# Patient Record
Sex: Female | Born: 1970 | Race: White | Hispanic: Yes | Marital: Married | Smoking: Never smoker
Health system: Southern US, Community
[De-identification: ages and names within clinical notes are randomized; demographics above are authoritative.]

## PROBLEM LIST (undated history)

## (undated) DIAGNOSIS — G8929 Other chronic pain: Secondary | ICD-10-CM

## (undated) DIAGNOSIS — E349 Endocrine disorder, unspecified: Secondary | ICD-10-CM

## (undated) DIAGNOSIS — F419 Anxiety disorder, unspecified: Secondary | ICD-10-CM

## (undated) DIAGNOSIS — F32A Depression, unspecified: Secondary | ICD-10-CM

## (undated) DIAGNOSIS — M549 Dorsalgia, unspecified: Secondary | ICD-10-CM

## (undated) DIAGNOSIS — E079 Disorder of thyroid, unspecified: Secondary | ICD-10-CM

## (undated) DIAGNOSIS — F329 Major depressive disorder, single episode, unspecified: Secondary | ICD-10-CM

## (undated) HISTORY — DX: Endocrine disorder, unspecified: E34.9

## (undated) HISTORY — PX: TUBAL LIGATION: SHX77

## (undated) HISTORY — DX: Disorder of thyroid, unspecified: E07.9

## (undated) HISTORY — DX: Dorsalgia, unspecified: M54.9

## (undated) HISTORY — DX: Anxiety disorder, unspecified: F41.9

## (undated) HISTORY — PX: BACK SURGERY: SHX140

## (undated) HISTORY — DX: Other chronic pain: G89.29

## (undated) HISTORY — PX: PELVIC LAPAROSCOPY: SHX162

## (undated) HISTORY — DX: Depression, unspecified: F32.A

## (undated) HISTORY — DX: Major depressive disorder, single episode, unspecified: F32.9

---

## 2015-09-08 HISTORY — PX: HEMORRHOID SURGERY: SHX153

## 2016-08-14 ENCOUNTER — Ambulatory Visit: Payer: Medicaid Other | Admitting: Family Medicine

## 2016-08-26 ENCOUNTER — Ambulatory Visit: Payer: Medicaid Other | Admitting: Family Medicine

## 2016-12-07 ENCOUNTER — Ambulatory Visit: Payer: Self-pay | Admitting: Obstetrics & Gynecology

## 2016-12-07 ENCOUNTER — Ambulatory Visit: Payer: Self-pay | Admitting: Gynecology

## 2016-12-21 ENCOUNTER — Ambulatory Visit: Payer: Self-pay | Admitting: Obstetrics & Gynecology

## 2016-12-29 ENCOUNTER — Other Ambulatory Visit: Payer: Self-pay | Admitting: Internal Medicine

## 2016-12-29 DIAGNOSIS — Z1231 Encounter for screening mammogram for malignant neoplasm of breast: Secondary | ICD-10-CM

## 2016-12-30 ENCOUNTER — Ambulatory Visit: Payer: Medicare HMO | Admitting: Obstetrics & Gynecology

## 2017-01-07 ENCOUNTER — Ambulatory Visit (INDEPENDENT_AMBULATORY_CARE_PROVIDER_SITE_OTHER): Payer: Medicare HMO | Admitting: Obstetrics & Gynecology

## 2017-01-07 ENCOUNTER — Encounter: Payer: Self-pay | Admitting: Obstetrics & Gynecology

## 2017-01-07 VITALS — BP 142/86 | Ht 62.5 in | Wt 168.0 lb

## 2017-01-07 DIAGNOSIS — N920 Excessive and frequent menstruation with regular cycle: Secondary | ICD-10-CM

## 2017-01-07 DIAGNOSIS — Z113 Encounter for screening for infections with a predominantly sexual mode of transmission: Secondary | ICD-10-CM

## 2017-01-07 DIAGNOSIS — N9412 Deep dyspareunia: Secondary | ICD-10-CM | POA: Diagnosis not present

## 2017-01-07 DIAGNOSIS — Z1151 Encounter for screening for human papillomavirus (HPV): Secondary | ICD-10-CM

## 2017-01-07 DIAGNOSIS — R102 Pelvic and perineal pain: Secondary | ICD-10-CM | POA: Diagnosis not present

## 2017-01-07 DIAGNOSIS — Z01411 Encounter for gynecological examination (general) (routine) with abnormal findings: Secondary | ICD-10-CM | POA: Diagnosis not present

## 2017-01-07 LAB — CBC
HCT: 39.3 % (ref 35.0–45.0)
Hemoglobin: 12.9 g/dL (ref 11.7–15.5)
MCH: 29.5 pg (ref 27.0–33.0)
MCHC: 32.8 g/dL (ref 32.0–36.0)
MCV: 89.9 fL (ref 80.0–100.0)
MPV: 10.4 fL (ref 7.5–12.5)
PLATELETS: 300 10*3/uL (ref 140–400)
RBC: 4.37 MIL/uL (ref 3.80–5.10)
RDW: 12.9 % (ref 11.0–15.0)
WBC: 6.5 10*3/uL (ref 3.8–10.8)

## 2017-01-07 LAB — TSH: TSH: 2.5 mIU/L

## 2017-01-07 NOTE — Patient Instructions (Addendum)
1. Encounter for gynecological examination with abnormal finding Gyn exam moderately tender with slightly increased size of Uterus.  Pap test/HPV HR, Gono-Chlam done.  2. Female pelvic pain Complete STI screen.  F/U Pelvic US to assess Uterus and Ovaries.  R/O Fibroids, Ovarian mass. - HIV antibody - RPR - Hepatitis C Antibody - Hepatitis B Surface AntiGEN - US Transvaginal Non-OB; Future  3. Deep dyspareunia Investigation as above.  4. Menorrhagia with regular cycle R/O Anemia/Undertreated Hypothyroidism.  F/U Pelvic US, R/O IU lesion/Polyps/Fibroids/Thickened Endometrium. - CBC - TSH - US Transvaginal Non-OB; Future  Fue un placer conocerla!

## 2017-01-07 NOTE — Progress Notes (Signed)
Jamie SaverRosa H Garner Jamie Garner [birth year] [id number]   History:    46 y.o. [id number] Married.  S/P BT/S.  New patient presenting for annual gyn exam   Past medical history,surgical history, family history and social history were all reviewed and documented in the EPIC chart.  Gynecologic History Patient's last menstrual period was 12/30/2016. Contraception: tubal ligation Last Pap: 2015. Results were: normal Last mammogram: 2016. Results were: normal  Obstetric History OB History  Gravida Para Term Preterm AB Living  4 3     1 3   SAB TAB Ectopic Multiple Live Births  1            # Outcome Date GA Lbr Len/2nd Weight Sex Delivery Anes PTL Lv  4 SAB           3 Para           2 Para           1 Para                ROS: A ROS was performed and pertinent positives and negatives are included in the history.  GENERAL: No fevers or chills. HEENT: No change in vision, no earache, sore throat or sinus congestion. NECK: No pain or stiffness. CARDIOVASCULAR: No chest pain or pressure. No palpitations. PULMONARY: No shortness of breath, cough or wheeze. GASTROINTESTINAL: No abdominal pain, nausea, vomiting or diarrhea, melena or bright red blood per rectum. GENITOURINARY: No urinary frequency, urgency, hesitancy or dysuria. MUSCULOSKELETAL: No joint or muscle pain, no back pain, no recent trauma. DERMATOLOGIC: No rash, no itching, no lesions. ENDOCRINE: No polyuria, polydipsia, no heat or cold intolerance. No recent change in weight. HEMATOLOGICAL: No anemia or easy bruising or bleeding. NEUROLOGIC: No headache, seizures, numbness, tingling or weakness. PSYCHIATRIC: No depression, no loss of interest in normal activity or change in sleep pattern.     Exam:   BP (!) 142/86   Ht 5' 2.5" (1.588 m)   Wt 168 lb (76.2 kg)   LMP 12/30/2016   BMI 30.24 kg/m   Body mass index is 30.24 kg/m.  General appearance : Well developed well nourished female. No acute distress HEENT: Eyes: no retinal  hemorrhage or exudates,  Neck supple, trachea midline, no carotid bruits, no thyroidmegaly Lungs: Clear to auscultation, no rhonchi or wheezes, or rib retractions  Heart: Regular rate and rhythm, no murmurs or gallops Breast:Examined in sitting and supine position were symmetrical in appearance, no palpable masses or tenderness,  no skin retraction, no nipple inversion, no nipple discharge, no skin discoloration, no axillary or supraclavicular lymphadenopathy Abdomen: no palpable masses or tenderness, no rebound or guarding Extremities: no edema or skin discoloration or tenderness  Pelvic:  Bartholin, Urethra, Skene Glands: Within normal limits             Vagina: No gross lesions or discharge  Cervix: No gross lesions or discharge.  Pap/HPV/Gono-Chlam done  Uterus  AV, slightly increased size, shape and consistency, moderately tender and mobile  Adnexa  Without masses or tenderness  Anus and perineum  normal     Assessment/Plan:  46 y.o. female for annual exam   1. Encounter for gynecological examination with abnormal finding Gyn exam moderately tender with slightly increased size of Uterus.  Pap test/HPV HR, Gono-Chlam done.  2. Female pelvic pain Complete STI screen.  F/U Pelvic US to assess Uterus and Ovaries.  R/O Fibroids, Ovarian mass. - HIV antibody - RPR - Hepatitis C Antibody - Hepatitis  B Surface AntiGEN - US Transvaginal Non-OB; Future  3. Deep dyspareunia As above  4. Menorrhagia with regular cycle R/O Anemia/Undertreated thyroidism.  F/U Pelvic US, R/O IU lesion/Polyps/Fibroids/Thickened Endometrium. - CBC - TSH - US Transvaginal Non-OB; Future  Counseling >50% x 20 minutes on above issues/Investigation and Treatment.  Genia Del MD, 12:27 PM 01/07/2017

## 2017-01-08 LAB — HEPATITIS B SURFACE ANTIGEN: HEP B S AG: NEGATIVE

## 2017-01-08 LAB — RPR

## 2017-01-08 LAB — HEPATITIS C ANTIBODY: HCV AB: NEGATIVE

## 2017-01-08 LAB — PAP IG, CT-NG NAA, HPV HIGH-RISK
Chlamydia Probe Amp: NOT DETECTED
GC Probe Amp: NOT DETECTED
HPV DNA HIGH RISK: NOT DETECTED

## 2017-01-08 LAB — HIV ANTIBODY (ROUTINE TESTING W REFLEX): HIV 1&2 Ab, 4th Generation: NONREACTIVE

## 2017-01-13 ENCOUNTER — Ambulatory Visit (INDEPENDENT_AMBULATORY_CARE_PROVIDER_SITE_OTHER): Payer: Medicare HMO

## 2017-01-13 ENCOUNTER — Encounter: Payer: Self-pay | Admitting: Obstetrics & Gynecology

## 2017-01-13 ENCOUNTER — Ambulatory Visit (INDEPENDENT_AMBULATORY_CARE_PROVIDER_SITE_OTHER): Payer: Medicare HMO | Admitting: Obstetrics & Gynecology

## 2017-01-13 ENCOUNTER — Other Ambulatory Visit: Payer: Self-pay | Admitting: Obstetrics & Gynecology

## 2017-01-13 VITALS — BP 130/80 | Ht 62.0 in | Wt 168.0 lb

## 2017-01-13 DIAGNOSIS — N83299 Other ovarian cyst, unspecified side: Secondary | ICD-10-CM

## 2017-01-13 DIAGNOSIS — N920 Excessive and frequent menstruation with regular cycle: Secondary | ICD-10-CM

## 2017-01-13 DIAGNOSIS — R102 Pelvic and perineal pain: Secondary | ICD-10-CM | POA: Diagnosis not present

## 2017-01-13 DIAGNOSIS — N83202 Unspecified ovarian cyst, left side: Secondary | ICD-10-CM

## 2017-01-13 MED ORDER — NORETHINDRONE 0.35 MG PO TABS: 1.0000 | ORAL_TABLET | Freq: Every day | ORAL | 4 refills | Status: AC

## 2017-01-13 NOTE — Progress Notes (Signed)
    Jamie Garner 04/30/1971 [id number]        46 y.o.  [id number]   RP:  F/U Menorrhagia and pelvic pain for Pelvic US  Past medical history,surgical history, problem list, medications, allergies, family history and social history were all reviewed and documented in the EPIC chart.  Directed ROS with pertinent positives and negatives documented in the history of present illness/assessment and plan.  Exam:  Vitals:   01/13/17 1228  BP: 130/80  Weight: 168 lb (76.2 kg)  Height: 5\' 2"  (1.575 m)   General appearance:  Normal  Pelvic US today:  T/V Anteverted Uterus with homogeneous echo.  Tri-Layered endometrium.  Endometrial line at 13.7 mm.  Rt Ovary with a collapsing cyst.  No increased flow to the cyst wall by doppler.  Lt Ovary with 2 follicular simple cysts at 2.4 cm and 2.5 cm.  Dopplers negative to both cysts.  No FF in CDS.  Hb 12.9 TSH 2.5 normal  Assessment/Plan:  46 y.o. [id number]   1. Menorrhagia with regular cycle Pelvic US with normal Endometrial line, on day #15 of cycle, at 13.7 mm trilayered. US findings and labs reviewed with patient.  Patient reassured about results.  Decision to start on Progestin-only pill to control Sxs, both the heavy menstrual flow and decrease the occurrence of functional ovarian cysts.  Usage/risks/benefits reviewed.  2. Functional ovarian cysts Follicular ovarian cysts.    Counseling on above issues >50% x 15 minutes.  Genia DelMarie-Lyne Snow Peoples MD, 12:51 PM 01/13/2017

## 2017-01-13 NOTE — Patient Instructions (Signed)
1. Menorrhagia with regular cycle Pelvic US with normal Endometrial line, on day #15 of cycle, at 13.7 mm trilayered. US findings and labs reviewed with patient.  Patient reassured about results.  Decision to start on Progestin-only pill to control Sxs, both the heavy menstrual flow and decrease the occurrence of functional ovarian cysts.  Usage/risks/benefits reviewed.  2. Functional ovarian cysts Follicular ovarian cysts.    Jamie Garner, if you do well with the Progestin-only pill, I will see you back for your Annual-Gyn exam in a year.  Please call if you need me before that time.

## 2017-01-15 ENCOUNTER — Ambulatory Visit
Admission: RE | Admit: 2017-01-15 | Discharge: 2017-01-15 | Disposition: A | Discharge status: Home / Self Care | Payer: Medicare HMO | Source: Ambulatory Visit | Attending: Internal Medicine | Admitting: Internal Medicine

## 2017-01-15 DIAGNOSIS — Z1231 Encounter for screening mammogram for malignant neoplasm of breast: Secondary | ICD-10-CM

## 2017-01-20 ENCOUNTER — Encounter: Payer: Self-pay | Admitting: Gynecology

## 2017-03-10 ENCOUNTER — Emergency Department (HOSPITAL_COMMUNITY)
Admission: EM | Admit: 2017-03-10 | Discharge: 2017-03-11 | Disposition: A | Discharge status: Home / Self Care | Payer: Medicare HMO | Attending: Emergency Medicine | Admitting: Emergency Medicine

## 2017-03-10 ENCOUNTER — Encounter (HOSPITAL_COMMUNITY): Payer: Self-pay | Admitting: Vascular Surgery

## 2017-03-10 DIAGNOSIS — Z79899 Other long term (current) drug therapy: Secondary | ICD-10-CM | POA: Diagnosis not present

## 2017-03-10 DIAGNOSIS — N920 Excessive and frequent menstruation with regular cycle: Secondary | ICD-10-CM | POA: Diagnosis not present

## 2017-03-10 DIAGNOSIS — N938 Other specified abnormal uterine and vaginal bleeding: Secondary | ICD-10-CM | POA: Diagnosis present

## 2017-03-10 LAB — CBC WITH DIFFERENTIAL/PLATELET
BASOS PCT: 0 %
Basophils Absolute: 0 10*3/uL (ref 0.0–0.1)
EOS ABS: 0.5 10*3/uL (ref 0.0–0.7)
Eosinophils Relative: 7 %
HCT: 39.3 % (ref 36.0–46.0)
Hemoglobin: 13 g/dL (ref 12.0–15.0)
Lymphocytes Relative: 32 %
Lymphs Abs: 2.2 10*3/uL (ref 0.7–4.0)
MCH: 29.3 pg (ref 26.0–34.0)
MCHC: 33.1 g/dL (ref 30.0–36.0)
MCV: 88.5 fL (ref 78.0–100.0)
MONO ABS: 0.5 10*3/uL (ref 0.1–1.0)
MONOS PCT: 7 %
Neutro Abs: 3.8 10*3/uL (ref 1.7–7.7)
Neutrophils Relative %: 54 %
PLATELETS: 310 10*3/uL (ref 150–400)
RBC: 4.44 MIL/uL (ref 3.87–5.11)
RDW: 13.4 % (ref 11.5–15.5)
WBC: 7 10*3/uL (ref 4.0–10.5)

## 2017-03-10 LAB — BASIC METABOLIC PANEL
Anion gap: 7 (ref 5–15)
BUN: 16 mg/dL (ref 6–20)
CALCIUM: 9 mg/dL (ref 8.9–10.3)
CO2: 22 mmol/L (ref 22–32)
CREATININE: 0.69 mg/dL (ref 0.44–1.00)
Chloride: 106 mmol/L (ref 101–111)
Glucose, Bld: 94 mg/dL (ref 65–99)
Potassium: 4.8 mmol/L (ref 3.5–5.1)
SODIUM: 135 mmol/L (ref 135–145)

## 2017-03-10 LAB — I-STAT BETA HCG BLOOD, ED (MC, WL, AP ONLY)

## 2017-03-10 MED ORDER — ONDANSETRON 4 MG PO TBDP
4.0000 mg | ORAL_TABLET | Freq: Once | ORAL | Status: AC
Start: 2017-03-10 — End: 2017-03-10
  Administered 2017-03-10: 4 mg via ORAL
  Filled 2017-03-10: qty 1

## 2017-03-10 MED ORDER — HYDROCODONE-ACETAMINOPHEN 5-325 MG PO TABS
1.0000 | ORAL_TABLET | Freq: Once | ORAL | Status: AC
  Administered 2017-03-10: 1 via ORAL
  Filled 2017-03-10: qty 1

## 2017-03-10 NOTE — ED Provider Notes (Signed)
[facility]-EMERGENCY DEPT Provider Note   By signing my name below, I, Earmon Phoenix, attest that this documentation has been prepared under the direction and in the presence of [facility], [place]. Electronically Signed: Earmon Phoenix, ED Scribe. 03/10/17. 12:09 AM.    History   Chief Complaint Chief Complaint  Patient presents with  . Vaginal Bleeding   The history is provided by the patient and medical records. A language interpreter was used.    Jamie Garner is a 46 y.o. female who presents to the Emergency Department complaining of constant vaginal bleeding for the past 3 weeks. She reports passing blood clots as well. She reports associated lower abdominal pain that radiates to her back, palpitations, SOB, nausea, dizziness and generalized weakness. She rates the pain at 9/10. She states she soaks through about 1 large pads several times daily. She has been not taken anything for pain. There are no modifying factors noted. She denies fever, chills, vomiting. She states her OB/GYN is Dr. Seymour Bars and last saw her two months ago and was prescribe birth control pills. She states three cysts were noted at the visit but does not specify location. She reports that Dr. Seymour Bars told her if the problem persist that she will need to have a hysterectomy. She does not have a follow up appt scheduled. She reports having an ultrasound while in the office. She reports an allergy to ASA with a reaction of eye swelling.   Past Medical History:  Diagnosis Date  . Anxiety   . Back pain, chronic   . Depression   . Hormone disorder   . Thyroid disease     There are no active problems to display for this patient.   Past Surgical History:  Procedure Laterality Date  . BACK SURGERY  2011, 2013, 2015   x3  . HEMORRHOID SURGERY  2017  . PELVIC LAPAROSCOPY     ovarian cyst removed  . TUBAL LIGATION      OB History    Gravida Para Term Preterm AB Living   4 3     1 3    SAB TAB Ectopic  Multiple Live Births   1               Home Medications    Prior to Admission medications   Medication Sig Start Date End Date Taking? Authorizing Provider  cholecalciferol (VITAMIN D) 1000 units tablet Take 1,000 Units by mouth daily.    [provider]  clonazePAM (KLONOPIN) 1 MG tablet Take 1 mg by mouth 2 (two) times daily.    [provider]  FLUoxetine (PROZAC) 20 MG tablet Take 20 mg by mouth daily.    [provider]  gabapentin (NEURONTIN) 800 MG tablet Take 800 mg by mouth 3 (three) times daily.    [provider]  levothyroxine (SYNTHROID, LEVOTHROID) 50 MCG tablet Take 50 mcg by mouth daily before breakfast.    [provider]  nabumetone (RELAFEN) 500 MG tablet Take 500 mg by mouth daily.    [provider]  norethindrone (ORTHO MICRONOR) 0.35 MG tablet Take 1 tablet (0.35 mg total) by mouth daily. 01/13/17   Genia Del, MD  traMADol (ULTRAM) 50 MG tablet Take 1 tablet (50 mg total) by mouth every 6 (six) hours as needed. 03/11/17   Janne Napoleon, NP    Family History Family History  Problem Relation Age of Onset  . Breast cancer Maternal Grandmother   . Diabetes Maternal Grandfather  Social History Social History  Substance Use Topics  . Smoking status: Never Smoker  . Smokeless tobacco: Never Used  . Alcohol use No     Allergies   Aspirin and Penicillins   Review of Systems Review of Systems  Constitutional: Positive for fatigue.  Respiratory: Positive for shortness of breath.   Cardiovascular: Positive for palpitations.  Gastrointestinal: Positive for abdominal pain and nausea. Negative for vomiting.  Genitourinary: Positive for vaginal bleeding.  Musculoskeletal: Positive for back pain.  Neurological: Positive for dizziness and weakness (generalized).     Physical Exam Updated Vital Signs BP (!) 149/89 (BP Location: Right Arm)   Pulse 70   Temp 98.1 F (36.7 C) (Oral)   Resp 20    SpO2 99%   Physical Exam  Constitutional: She is oriented to person, place, and time. She appears well-developed and well-nourished. No distress.  HENT:  Head: Normocephalic.  Eyes: EOM are normal.  Neck: Neck supple.  Cardiovascular: Normal rate.   Pulmonary/Chest: Effort normal.  Abdominal: Soft. Bowel sounds are normal. There is tenderness.  Tender with deep palpation of the lower abdomen.   Genitourinary:  Genitourinary Comments: No blood on pad patient removed in exam room. External genitalia without lesions, scant dark blood vaginal vault, cervix closed, no CMT, no adnexal mass palpated, uterus without palpable enlargement.   Musculoskeletal: Normal range of motion.  Neurological: She is alert and oriented to person, place, and time. No cranial nerve deficit.  Skin: Skin is warm and dry.  Psychiatric: She has a normal mood and affect. Her behavior is normal.  Nursing note and vitals reviewed.    ED Treatments / Results  DIAGNOSTIC STUDIES: Oxygen Saturation is 99% on RA, normal by my interpretation.   COORDINATION OF CARE: 10:59 PM- Will order labs and perform pelvic exam. Will order pain and nausea medication. Pt verbalizes understanding and agrees to plan.  Medications  ondansetron (ZOFRAN-ODT) disintegrating tablet 4 mg (4 mg Oral Given 03/10/17 2354)  HYDROcodone-acetaminophen (NORCO/VICODIN) 5-325 MG per tablet 1 tablet (1 tablet Oral Given 03/10/17 2354)    Labs (all labs ordered are listed, but only abnormal results are displayed) Labs Reviewed  WET PREP, GENITAL  CBC WITH DIFFERENTIAL/PLATELET  BASIC METABOLIC PANEL  I-STAT BETA HCG BLOOD, ED ([facility], [facility], [facility] ONLY)  GC/CHLAMYDIA PROBE AMP ([place]) NOT AT [facility]   Radiology No results found.  Procedures Pelvic exam Date/Time: 03/10/2017 11:00 PM Performed by: Janne NapoleonNEESE, Yazleemar Strassner M Authorized by: Janne NapoleonNEESE, Kristiane Morsch M  Consent: Verbal consent obtained. Risks and benefits: risks, benefits and alternatives were  discussed Consent given by: patient Patient understanding: patient states understanding of the procedure being performed Test results: test results available and properly labeled Required items: required blood products, implants, devices, and special equipment available Patient identity confirmed: verbally with patient Local anesthesia used: no  Anesthesia: Local anesthesia used: no  Sedation: Patient sedated: no Patient tolerance: Patient tolerated the procedure well with no immediate complications    (including critical care time)  Medications Ordered in ED Medications  ondansetron (ZOFRAN-ODT) disintegrating tablet 4 mg (4 mg Oral Given 03/10/17 2354)  HYDROcodone-acetaminophen (NORCO/VICODIN) 5-325 MG per tablet 1 tablet (1 tablet Oral Given 03/10/17 2354)     Initial Impression / Assessment and Plan / ED Course  I have reviewed the triage vital signs and the nursing notes.  Pertinent lab results that were available during my care of the patient were reviewed by me and considered in my medical decision making (see chart for details).  Final Clinical Impressions(s) / ED Diagnoses  46 y.o. female with hx of heavy vaginal bleeding and pain and currently under treatment by Dr. Seymour Bars (GYN) stable for d/c without active bleeding and normal CBC. She is not orthostatic and does not have a surgical abdomen. Instructed the patient to f/u with DR. Lavoie or if symptoms worsen go to [facility] for further evaluation.  Final diagnoses:  Menorrhagia with regular cycle    New Prescriptions New Prescriptions   TRAMADOL (ULTRAM) 50 MG TABLET    Take 1 tablet (50 mg total) by mouth every 6 (six) hours as needed.   I personally performed the services described in this documentation, which was scribed in my presence. The recorded information has been reviewed and is accurate.    Kerrie [place], [place] 03/11/17 0017    Lavera Guise, MD 03/11/17 [phone]

## 2017-03-10 NOTE — ED Notes (Signed)
ED Provider at bedside. 

## 2017-03-10 NOTE — ED Triage Notes (Signed)
Pt reports to the ED for eval of vaginal bleeding. Onset June 13th. She states that the bleeding has been constant since then. She also reports generalized weakness, tachycardia, and lower abd pain. She went to her OB-GYN and she was given the birth control pills 2 months ago and she has been taking them since but they are no longer helping. Pt is spanish speaking, language line use for triage.

## 2017-03-11 LAB — WET PREP, GENITAL
CLUE CELLS WET PREP: NONE SEEN
SPERM: NONE SEEN
TRICH WET PREP: NONE SEEN
YEAST WET PREP: NONE SEEN

## 2017-03-11 LAB — GC/CHLAMYDIA PROBE AMP (~~LOC~~) NOT AT ARMC
Chlamydia: NEGATIVE
Neisseria Gonorrhea: NEGATIVE

## 2017-03-11 MED ORDER — TRAMADOL HCL 50 MG PO TABS: 50.0000 mg | ORAL_TABLET | Freq: Four times a day (QID) | ORAL | 0 refills | Status: DC | PRN

## 2017-03-11 NOTE — Discharge Instructions (Signed)
Your blood work tonight and vital signs show that your bleeding has not caused you to be anemic. We are treating your pain. Continue to take the pills that Dr. Seymour BarsLavoie prescribed for your bleeding. Call Dr. Seymour BarsLavoie tomorrow to schedule a follow up appointment. If your symptoms worsen before then go to [facility] Emergency Department for further evaluation.

## 2017-03-17 ENCOUNTER — Ambulatory Visit (INDEPENDENT_AMBULATORY_CARE_PROVIDER_SITE_OTHER): Payer: Medicare HMO | Admitting: Specialist

## 2017-04-07 ENCOUNTER — Ambulatory Visit (INDEPENDENT_AMBULATORY_CARE_PROVIDER_SITE_OTHER): Payer: Medicare HMO

## 2017-04-07 ENCOUNTER — Encounter (INDEPENDENT_AMBULATORY_CARE_PROVIDER_SITE_OTHER): Payer: Self-pay | Admitting: Specialist

## 2017-04-07 ENCOUNTER — Ambulatory Visit (INDEPENDENT_AMBULATORY_CARE_PROVIDER_SITE_OTHER): Payer: Medicare HMO | Admitting: Specialist

## 2017-04-07 VITALS — BP 118/82 | HR 71 | Ht 63.0 in | Wt 168.0 lb

## 2017-04-07 DIAGNOSIS — G8929 Other chronic pain: Secondary | ICD-10-CM | POA: Diagnosis not present

## 2017-04-07 DIAGNOSIS — R29898 Other symptoms and signs involving the musculoskeletal system: Secondary | ICD-10-CM | POA: Diagnosis not present

## 2017-04-07 DIAGNOSIS — Z981 Arthrodesis status: Secondary | ICD-10-CM | POA: Diagnosis not present

## 2017-04-07 DIAGNOSIS — M5441 Lumbago with sciatica, right side: Secondary | ICD-10-CM

## 2017-04-07 MED ORDER — TRAMADOL HCL 50 MG PO TABS: 50.0000 mg | ORAL_TABLET | Freq: Four times a day (QID) | ORAL | 0 refills | Status: DC | PRN

## 2017-04-07 NOTE — Progress Notes (Signed)
Office Visit Note   Patient: Jamie Garner           Date of Birth: [birth year]           MRN: [id number] Visit Date: 04/07/2017              Requested by: Nolene Ebbs, MD [address] [place], [place] [address] PCP: Nolene Ebbs, MD   Assessment & Plan: Visit Diagnoses:  1. Chronic right-sided low back pain with right-sided sciatica   2. History of lumbar fusion   3. Weakness of both legs     Plan: Avoid frequent bending and stooping  No lifting greater than 10 lbs. May use ice or moist heat for pain. Weight loss is of benefit. See physical therapist to work on a strengthening program and ROM on the lumbar spine and hips. Tramadol only if tylenol is not of benefit. Meloxicam daily.   Follow-Up Instructions: Return in about 6 weeks (around 05/19/2017).   Orders:  Orders Placed This Encounter  Procedures  . XR Lumbar Spine 2-3 Views  . Ambulatory referral to Physical Therapy   Meds ordered this encounter  Medications  . traMADol (ULTRAM) 50 MG tablet    Sig: Take 1 tablet (50 mg total) by mouth every 6 (six) hours as needed for moderate pain.    Dispense:  30 tablet    Refill:  0      Procedures: No procedures performed   Clinical Data: No additional findings.   Subjective: Chief Complaint  Patient presents with  . Lower Back - Pain, Injury    History of 3 back surgeries, has had many falls    46 year old female with history of low back pain with radiation into the legs. 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 She was working there for 3 years and at the time she was spending  A lot of time sitting at a computer during the day and having to lift boxes with heavy papers. 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 She had a period of several days and  then had nerve blocks or injections with difficulty walking. She had to quit her job and afterwards she underwent surgical treatment in [place], under [place]. She underwent a laminectomy of the lumbar spine L5-S1 and then underwent a fusion L5-S1 and the third surgery with removal of the hardware. Before her first surgery she was having pain in her back and the right greater than left leg in the right posterior and front and sides. Told she had spinal stenosis. Told she had neuropathy in both legs. Also has been diagnosed with fibromyalgia. Post the first surgery she felt better for about one year then the pain returned only it was a different pain mainly in the back, buttocks and thighs. She was stooped and leaning to the left side, after 2 years she underwent fusion of the two levels L5-S1. She then returned to surgery for  A last surgery 2015 wih removal of hardware at the L5-S1 levels. Before the removal of the Hardware she was experiencing pain again into the right leg and the pain did not improve following the surgery. She feel s pain in the right buttock that is sharp and she has difficulty sleeping, has to move constantly to try and relieve  her pain, she does not remember sleeping comfortably for a long time after the first surgery perhaps. The pain she experiencing now is sharp all the time, constantly. I have been in this pain a long time and I do not tell everybody when. Pain increases with walking over the last week. She fell down 2 months ago, she feels like the right leg is weak since the fall. She was walking normally and has fallen several times this year, 4 times this year. In the past she has not fallen, she says that she has noted weakness in her right leg. She has pain with bending and stooping and lifting. She is having trouble doing her chores, cleaning the house or laundery washing. Right now she is only able to cook. Washing the dishes is a problem and increases pain.Pain is worse with  walking, bending and lifting anything heavy. Pain improves with lying down and with sitting. She is walking at the gym for 20 minutes 3 times a week. Medications help a little bit meloxicam and gabapentin. She is not taking the Tramadol is for more severe pain and she is out of that medication. Was having bleeding and was given that. Has been in [place] since 07/2016. She is here with her husband and two children. Bowel and bladder is with some constipation. Has occasional HAs all the time occipital and associated with pain into shoulders.      Review of Systems  Constitutional: Positive for activity change, fatigue and unexpected weight change. Negative for appetite change, chills and diaphoresis.  HENT: Positive for ear pain, hearing loss, rhinorrhea, sinus pain, sinus pressure, sneezing and voice change. Negative for ear discharge, facial swelling, sore throat, tinnitus and trouble swallowing.   Eyes: Positive for pain, redness and itching. Negative for photophobia and discharge.  Respiratory: Positive for shortness of breath and wheezing. Negative for apnea, cough, choking, chest tightness and stridor.   Cardiovascular: Positive for leg swelling. Negative for chest pain and palpitations.  Gastrointestinal: Positive for constipation, nausea and vomiting. Negative for abdominal distention, abdominal pain, anal bleeding, blood in stool and rectal pain.  Endocrine: Negative.  Negative for polydipsia, polyphagia and polyuria.  Genitourinary: Positive for menstrual problem and vaginal bleeding. Negative for decreased urine volume, dyspareunia, dysuria, enuresis, flank pain, frequency, genital sores, hematuria, pelvic pain, urgency, vaginal discharge and vaginal pain.  Musculoskeletal: Positive for arthralgias, back pain and gait problem. Negative for joint swelling, myalgias, neck pain and neck stiffness.  Skin: Negative.  Negative for color change, pallor, rash and wound.  Allergic/Immunologic: Positive for  environmental allergies. Negative for food allergies and immunocompromised state.  Neurological: Positive for speech difficulty, weakness, numbness and headaches. Negative for dizziness, tremors, seizures, syncope, facial asymmetry and light-headedness.  Hematological: Negative.  Negative for adenopathy. Does not bruise/bleed easily.  Psychiatric/Behavioral: Negative for agitation, behavioral problems, confusion, decreased concentration, dysphoric mood, hallucinations, self-injury, sleep disturbance and suicidal ideas. The patient is not nervous/anxious and is not hyperactive.      Objective: Vital Signs: BP 118/82 (BP Location: Left Arm, Patient Position: Sitting)   Pulse 71   Ht 5' 3"  (1.6 m)   Wt 168 lb (76.2 kg)   BMI 29.76 kg/m   Physical Exam  Constitutional: She is oriented to person, place, and time. She appears well-developed and well-nourished.  HENT:  Head: Normocephalic and atraumatic.  Eyes: Pupils are equal, round, and reactive to light. EOM are normal.  Neck: Normal range of motion. Neck supple.  Pulmonary/Chest: Effort normal and  breath sounds normal.  Abdominal: Soft. Bowel sounds are normal.  Musculoskeletal: Normal range of motion.  Neurological: She is alert and oriented to person, place, and time.  Skin: Skin is warm and dry.  Psychiatric: She has a normal mood and affect. Her behavior is normal. Judgment and thought content normal.    Ortho Exam  Specialty Comments:  No specialty comments available.  Imaging: No results found.   PMFS History: There are no active problems to display for this patient.  Past Medical History:  Diagnosis Date  . Anxiety   . Back pain, chronic   . Depression   . Hormone disorder   . Thyroid disease     Family History  Problem Relation Age of Onset  . Breast cancer Maternal Grandmother   . Diabetes Maternal Grandfather     Past Surgical History:  Procedure Laterality Date  . BACK SURGERY  2011, 2013, 2015   x3    . HEMORRHOID SURGERY  2017  . PELVIC LAPAROSCOPY     ovarian cyst removed  . TUBAL LIGATION     Social History   Occupational History  . Not on file.   Social History Main Topics  . Smoking status: Never Smoker  . Smokeless tobacco: Never Used  . Alcohol use No  . Drug use: No  . Sexual activity: Yes    Partners: Male

## 2017-04-07 NOTE — Patient Instructions (Signed)
Avoid frequent bending and stooping  No lifting greater than 10 lbs. May use ice or moist heat for pain. Weight loss is of benefit. See physical therapist to work on a strengthening program and ROM on the lumbar spine and hips. Tramadol only if tylenol is not of benefit. Meloxicam daily.

## 2017-04-13 ENCOUNTER — Encounter: Payer: Self-pay | Admitting: Physical Therapy

## 2017-04-13 ENCOUNTER — Ambulatory Visit: Payer: Medicare HMO | Attending: Specialist | Admitting: Physical Therapy

## 2017-04-13 DIAGNOSIS — M62830 Muscle spasm of back: Secondary | ICD-10-CM | POA: Diagnosis present

## 2017-04-13 DIAGNOSIS — M5441 Lumbago with sciatica, right side: Secondary | ICD-10-CM | POA: Diagnosis present

## 2017-04-13 DIAGNOSIS — R2689 Other abnormalities of gait and mobility: Secondary | ICD-10-CM | POA: Diagnosis present

## 2017-04-13 DIAGNOSIS — G8929 Other chronic pain: Secondary | ICD-10-CM | POA: Diagnosis present

## 2017-04-13 DIAGNOSIS — M6281 Muscle weakness (generalized): Secondary | ICD-10-CM | POA: Diagnosis present

## 2017-04-13 NOTE — Therapy (Signed)
[facility] [address] [place], [place], [address] Phone: [phone]   Fax:  [phone]  Physical Therapy Evaluation  Patient Details  Name: Mende Biswell MRN: [id number] Date of Birth: 03-30-1971 Referring Provider: Vira Browns MD  Encounter Date: 04/13/2017      PT End of Session - 04/13/17 1236    Visit Number 1   Number of Visits 17   Date for PT Re-Evaluation 06/08/17   Authorization Type MCR: KX mod by 15th visit, Progress not by 10th visit or 05/14/2017.    PT Start Time 1146   PT Stop Time 1235   PT Time Calculation (min) 49 min   Activity Tolerance Patient tolerated treatment well   Behavior During Therapy WFL for tasks assessed/performed      Past Medical History:  Diagnosis Date  . Anxiety   . Back pain, chronic   . Depression   . Hormone disorder   . Thyroid disease     Past Surgical History:  Procedure Laterality Date  . BACK SURGERY  2011, 2013, 2015   x3  . HEMORRHOID SURGERY  2017  . PELVIC LAPAROSCOPY     ovarian cyst removed  . TUBAL LIGATION      There were no vitals filed for this visit.       Subjective Assessment - 04/13/17 1152    Subjective pt is a 46 y.o F with CC of low back pain for long period of time and had a hx of 1 fusion and 2 laminectomy surgeries in the  back with the last in 2015, and had physical therapy in [place] pt reported falling today before her appointment hitting both knees and hands due to catching her foot on a curb and fell forward. Pain is in both sides of the low back but right now the R hurts more due to the fall. Numbness and Tingling noted in the R leg into the toes. pt reports having 5 falls in the last 6 months.    Pertinent History hx of 3 months   Limitations House hold activities;Lifting;Standing  cleaning the house, folding clothes,    How long can you sit comfortably? 20 min   How long can you stand comfortably? 20 min   How long can  you walk comfortably? 30 min   Diagnostic tests X-ray, MRI   Patient Stated Goals minimize pain, sleep better, increase strength in legs, to stop falling.    Currently in Pain? Yes   Pain Score 9   before falling pain was 6/10   Pain Location Back   Pain Orientation Right;Left   Pain Descriptors / Indicators Aching;Sharp;Sore;Stabbing;Pins and needles   Pain Type Chronic pain;Acute pain   Pain Radiating Towards to the R foot   Pain Onset More than a month ago   Pain Frequency Constant   Aggravating Factors  prolonged standing, walking, lifting, any house work/ cooking, shopping   Pain Relieving Factors go home and take a bath / lay down, sitting, medicine   Effect of Pain on Daily Activities limited endurance, standing/ walking, moving around, house hold activities            [facility] PT Assessment - 04/13/17 1207      Assessment   Medical Diagnosis low back pain   Referring Provider Vira Browns MD   Onset Date/Surgical Date --  for a long time   Hand Dominance Right   Next MD Visit 3 months   Prior  Therapy Yes  [place]     Precautions   Precautions --   Precaution Comments no lifting / carrying over 10#, no bending forward or exericse that involved bending at the waist,   to stop and rest when tired     Restrictions   Weight Bearing Restrictions No     Balance Screen   Has the patient fallen in the past 6 months Yes   How many times? 5   Has the patient had a decrease in activity level because of a fear of falling?  Yes   Is the patient reluctant to leave their home because of a fear of falling?  No     Home Environment   Living Environment Private residence   Living Arrangements Spouse/significant other   Available Help at Discharge Family;Available PRN/intermittently   Type of Home Apartment   Home Access Level entry   Entrance Stairs-Number of Steps --   Home Layout Two level   Alternate Level Stairs-Number of Steps 13   Alternate Level Stairs-Rails Right      Prior Function   Level of Independence Independent with basic ADLs;Needs assistance with homemaking;Needs assistance with ADLs   Laundry Moderate   Vacuuming Moderate   Vocation On disability   Leisure reading, listen to music, church     Cognition   Overall Cognitive Status Within Functional Limits for tasks assessed     Observation/Other Assessments   Focus on Therapeutic Outcomes (FOTO)  56% limited  predicted 49% limited     Posture/Postural Control   Posture/Postural Control Postural limitations   Postural Limitations Rounded Shoulders;Forward head;Decreased thoracic kyphosis     ROM / Strength   AROM / PROM / Strength AROM;Strength     AROM   AROM Assessment Site Lumbar   Lumbar Flexion 6  ERP   Lumbar Extension 2  ERP   Lumbar - Right Side Bend 15  ERP   Lumbar - Left Side Bend 5  ERP     Strength   Strength Assessment Site Hip;Knee   Right/Left Hip Right;Left   Right Hip Flexion 3/5   Right Hip Extension 3-/5   Right Hip ABduction 2+/5   Right Hip ADduction 3+/5   Left Hip Flexion 4-/5   Left Hip Extension 3+/5   Left Hip ABduction 3/5   Left Hip ADduction 3+/5   Right/Left Knee Right;Left   Right Knee Flexion 3-/5   Right Knee Extension 3+/5   Left Knee Flexion 4/5   Left Knee Extension 4/5     Palpation   Spinal mobility hypomobility of L1-L5 PAIVM   Palpation comment TTP in bil paraspinals with R>L and R SIJ, tenderness in proximal hip flexors/ glute med/min     Ambulation/Gait   Gait Pattern Step-through pattern;Decreased stride length;Trendelenburg;Antalgic            Objective measurements completed on examination: See above findings.                  PT Education - 04/13/17 1233    Education provided Yes   Education Details evaluation findings, POC, goals, HEP with proper form, discussed if pain from fall continues or worsens to go to urgent care of call her MD.   extra time needed for interpretation   Person(s)  Educated Patient;Other (comment)  interpreter   Methods Explanation;Verbal cues;Handout   Comprehension Verbalized understanding;Verbal cues required          PT Short Term Goals - 04/13/17 1253  PT SHORT TERM GOAL #1   Title pt to be I with intial HEP    Time 4   Period Weeks   Status New   Target Date 05/11/17     PT SHORT TERM GOAL #2   Title pt to demo proper posture and lifting mechanics to prevent and reduce low back pain   Time 4   Period Weeks   Status New     PT SHORT TERM GOAL #3   Title pt to reduce muscle spasm int he low back / hip to reduce pain to </= 6/10 and promote trunk and hip mobility    Time 4   Period Weeks   Status New   Target Date 05/11/17     PT SHORT TERM GOAL #4   Title pt to increase trunk mobility by >/= 10 degrees in all planes to with </= 6/10 pain to promote trunk mobility   Time 4   Period Weeks   Status New   Target Date 05/11/17           PT Long Term Goals - 04/13/17 1255      PT LONG TERM GOAL #1   Title pt to increase trunk flexion to >/= 40 degrees and extension/  bil side bending to >/= 18 degrees with </= 4/10 to promote functional trunk mobility    Time 8   Period Weeks   Status New   Target Date 06/08/17     PT LONG TERM GOAL #2   Title improve bil LE strength to >/= 4/5 in to provide hip/ knee stability with walking / standing for safety    Time 8   Period Weeks   Status New   Target Date 06/08/17     PT LONG TERM GOAL #3   Title pt to be able to stand / walk for >/= 45 min and navigate >/= 15 steps reciprocally with </= 5/10 pain for functional endurance for in home/ community ambulation    Time 8   Period Weeks   Status New   Target Date 06/08/17     PT LONG TERM GOAL #4   Title increase FOTO to </=49% limited to demo improvement in function    Time 8   Period Weeks   Status New   Target Date 06/08/17     PT LONG TERM GOAL #5   Title pt to be I with all HEP given as of last visit    Time 8    Period Weeks   Status New   Target Date 06/08/17                Plan - 04/13/17 1240    Clinical Impression Statement pt presents to OPPT with CC or low back pain reporting she has had it for along time with 3 low back surgerys in [place] with the last in 2015. pt reported falling onto both hands and knees today prior to her appointment and is having increase RLE and back pain. limited evaluation based on pt pain reseponse and muscle spasm. significant limted trunk ROM in all planes. Weakness in bil LE with R>L. TTP in bil paraspinals with R>L and R SIJ and hypombility of L1-L5. She would benefit from physical therapy to decrease muscle spasm, reduce pain, improve trunk mobility, improve stability/ safety and maximize her function by addressing the deficits listed.    History and Personal Factors relevant to plan of care: 3 low back surgeries, hx or  PT [place], chronic pain, recent fall prior coming into clinic   Clinical Presentation Unstable   Clinical Presentation due to: chronic pain, N/T, mulitple falls, bil weakness, spasm in the low back, limited trunk mobility,    Clinical Decision Making High   Rehab Potential Good   PT Frequency 2x / week   PT Duration 8 weeks   PT Treatment/Interventions ADLs/Self Care Home Management;Moist Heat;Therapeutic activities;Therapeutic exercise;Dry needling;Taping;Manual techniques;Cryotherapy;Electrical Stimulation;Iontophoresis 4mg /ml Dexamethasone;Ultrasound;Passive range of motion;Neuromuscular re-education;Balance training;Gait training;Functional mobility training;Patient/family education;Traction   PT Next Visit Plan assess/ review HEP and update PRN, BERG balance, manual for low back/ hip, core work, hip strengthening, trial gentle manual traction only, hip mobs?    PT Home Exercise Plan lower trunk rotation, posterior pelvic tilt   Consulted and Agree with Plan of Care Patient      Patient will benefit from skilled  therapeutic intervention in order to improve the following deficits and impairments:  Abnormal gait, Pain, Improper body mechanics, Postural dysfunction, Decreased endurance, Decreased activity tolerance, Decreased balance, Difficulty walking, Decreased strength, Decreased mobility, Increased fascial restricitons  Visit Diagnosis: Chronic bilateral low back pain with right-sided sciatica - Plan: PT plan of care cert/re-cert  Muscle weakness (generalized) - Plan: PT plan of care cert/re-cert  Muscle spasm of back - Plan: PT plan of care cert/re-cert  Other abnormalities of gait and mobility - Plan: PT plan of care cert/re-cert      G-Codes - 04/13/17 1300    Functional Assessment Tool Used (Outpatient Only) ROM/ Strength/ pain / FOTO   Functional Limitation Changing and maintaining body position   Changing and Maintaining Body Position Current Status ([id number]) At least 60 percent but less than 80 percent impaired, limited or restricted   Changing and Maintaining Body Position Goal Status ([id number](G8982) At least 40 percent but less than 60 percent impaired, limited or restricted       Problem List There are no active problems to display for this patient.  Lulu RidingKristoffer Leamon PT, DPT, LAT, ATC  04/13/17  1:03 PM      [facility] [address] [place], [place], [address] Phone: [phone]   Fax:  [phone]  Name: Homero FellersRosa H Olivo Maldonado MRN: [id number] Date of Birth: [birth year]

## 2017-04-14 ENCOUNTER — Ambulatory Visit: Payer: Medicare HMO | Admitting: Physical Therapy

## 2017-04-14 ENCOUNTER — Encounter: Payer: Self-pay | Admitting: Physical Therapy

## 2017-04-14 DIAGNOSIS — M6281 Muscle weakness (generalized): Secondary | ICD-10-CM

## 2017-04-14 DIAGNOSIS — M5441 Lumbago with sciatica, right side: Principal | ICD-10-CM

## 2017-04-14 DIAGNOSIS — M62830 Muscle spasm of back: Secondary | ICD-10-CM

## 2017-04-14 DIAGNOSIS — R2689 Other abnormalities of gait and mobility: Secondary | ICD-10-CM

## 2017-04-14 DIAGNOSIS — G8929 Other chronic pain: Secondary | ICD-10-CM

## 2017-04-14 NOTE — Therapy (Signed)
[facility] [address] [place], [place], [address] Phone: [phone]   Fax:  [phone]  Physical Therapy Treatment  Patient Details  Name: Jamie Garner MRN: [id number] Date of Birth: [birth year] Referring Provider: Vira Browns MD  Encounter Date: 04/14/2017      PT End of Session - 04/14/17 1810    Visit Number 2   Number of Visits 17   Date for PT Re-Evaluation 06/08/17   PT Start Time 1545   PT Stop Time 1645   PT Time Calculation (min) 60 min   Activity Tolerance Patient tolerated treatment well   Behavior During Therapy [facility] for tasks assessed/performed      Past Medical History:  Diagnosis Date  . Anxiety   . Back pain, chronic   . Depression   . Hormone disorder   . Thyroid disease     Past Surgical History:  Procedure Laterality Date  . BACK SURGERY  2011, 2013, 2015   x3  . HEMORRHOID SURGERY  2017  . PELVIC LAPAROSCOPY     ovarian cyst removed  . TUBAL LIGATION      There were no vitals filed for this visit.      Subjective Assessment - 04/14/17 1803    Subjective Whole back is sore from fall yesterday.  Whole back is tight.   Patient is accompained by: Interpreter   Currently in Pain? Yes   Pain Score 8    Pain Location Back   Pain Orientation Right;Left;Posterior   Pain Descriptors / Indicators --  tight,  sensitive   Pain Type Chronic pain;Acute pain            [facility] PT Assessment - 04/13/17 1207      Assessment   Medical Diagnosis low back pain   Referring Provider Vira Browns MD   Onset Date/Surgical Date --  for a long time   Hand Dominance Right   Next MD Visit 3 months   Prior Therapy Yes  [place]     Precautions   Precautions --   Precaution Comments no lifting / carrying over 10#, no bending forward or exericse that involved bending at the waist,   to stop and rest when tired     Restrictions   Weight Bearing Restrictions No     Balance Screen   Has the patient fallen in the past 6 months Yes   How many times? 5   Has the patient had a decrease in activity level because of a fear of falling?  Yes   Is the patient reluctant to leave their home because of a fear of falling?  No     Home Environment   Living Environment Private residence   Living Arrangements Spouse/significant other   Available Help at Discharge Family;Available PRN/intermittently   Type of Home Apartment   Home Access Level entry   Entrance Stairs-Number of Steps --   Home Layout Two level   Alternate Level Stairs-Number of Steps 13   Alternate Level Stairs-Rails Right     Prior Function   Level of Independence Independent with basic ADLs;Needs assistance with homemaking;Needs assistance with ADLs   Laundry Moderate   Vacuuming Moderate   Vocation On disability   Leisure reading, listen to music, church     Cognition   Overall Cognitive Status Within Functional Limits for tasks assessed     Observation/Other Assessments   Focus on Therapeutic Outcomes (FOTO)  56% limited  predicted 49% limited  Posture/Postural Control   Posture/Postural Control Postural limitations   Postural Limitations Rounded Shoulders;Forward head;Decreased thoracic kyphosis     ROM / Strength   AROM / PROM / Strength AROM;Strength     AROM   AROM Assessment Site Lumbar   Lumbar Flexion 6  ERP   Lumbar Extension 2  ERP   Lumbar - Right Side Bend 15  ERP   Lumbar - Left Side Bend 5  ERP     Strength   Strength Assessment Site Hip;Knee   Right/Left Hip Right;Left   Right Hip Flexion 3/5   Right Hip Extension 3-/5   Right Hip ABduction 2+/5   Right Hip ADduction 3+/5   Left Hip Flexion 4-/5   Left Hip Extension 3+/5   Left Hip ABduction 3/5   Left Hip ADduction 3+/5   Right/Left Knee Right;Left   Right Knee Flexion 3-/5   Right Knee Extension 3+/5   Left Knee Flexion 4/5   Left Knee Extension 4/5     Palpation   Spinal mobility hypomobility of L1-L5 PAIVM    Palpation comment TTP in bil paraspinals with R>L and R SIJ, tenderness in proximal hip flexors/ glute med/min     Ambulation/Gait   Gait Pattern Step-through pattern;Decreased stride length;Trendelenburg;Antalgic                     [facility] Adult PT Treatment/Exercise - 04/14/17 0001      Berg Balance Test   Sit to Stand Able to stand without using hands and stabilize independently   Standing Unsupported Able to stand safely 2 minutes   Sitting with Back Unsupported but Feet Supported on Floor or Stool Able to sit safely and securely 2 minutes   Stand to Sit Sits safely with minimal use of hands   Transfers Able to transfer safely, minor use of hands   Standing Ubsupported with Feet Together Able to place feet together independently and stand 1 minute safely   From Standing, Reach Forward with Outstretched Arm Can reach forward >12 cm safely (5")   From Standing Position, Pick up Object from Floor Able to pick up shoe, needs supervision   From Standing Position, Turn to Look Behind Over each Shoulder Looks behind one side only/other side shows less weight shift   Turn 360 Degrees Able to turn 360 degrees safely in 4 seconds or less   Standing Unsupported, Alternately Place Feet on Step/Stool Able to stand independently and safely and complete 8 steps in 20 seconds   Standing Unsupported, One Foot in [employer] Loses balance while stepping or standing   Standing on One Leg Able to lift leg independently and hold 5-10 seconds     Lumbar Exercises: Stretches   Single Knee to Chest Stretch 5 reps;10 seconds   Single Knee to Chest Stretch Limitations helped initially then she started to tighten anterior hip    Piriformis Stretch 1 rep;20 seconds  each.     Lumbar Exercises: Supine   Bridge Limitations 1 x   Straight Leg Raise 5 reps  right, AA     Moist Heat Therapy   Number Minutes Moist Heat 15 Minutes   Moist Heat Location Lumbar Spine  concurrent with IFC      Electrical Stimulation   Electrical Stimulation Location lumbar right gluteal   Electrical Stimulation Action IFC   Electrical Stimulation Parameters 8   Electrical Stimulation Goals Pain     Manual Therapy   Manual Therapy Soft tissue mobilization  Soft tissue mobilization low back and right hip ion side,  tissue softened.  light pressure used only                PT Education - 04/13/17 1233    Education provided Yes   Education Details evaluation findings, POC, goals, HEP with proper form, discussed if pain from fall continues or worsens to go to urgent care of call her MD.   extra time needed for interpretation   Person(s) Educated Patient;Other (comment)  interpreter   Methods Explanation;Verbal cues;Handout   Comprehension Verbalized understanding;Verbal cues required          PT Short Term Goals - 04/13/17 1253      PT SHORT TERM GOAL #1   Title pt to be I with intial HEP    Time 4   Period Weeks   Status New   Target Date 05/11/17     PT SHORT TERM GOAL #2   Title pt to demo proper posture and lifting mechanics to prevent and reduce low back pain   Time 4   Period Weeks   Status New     PT SHORT TERM GOAL #3   Title pt to reduce muscle spasm int he low back / hip to reduce pain to </= 6/10 and promote trunk and hip mobility    Time 4   Period Weeks   Status New   Target Date 05/11/17     PT SHORT TERM GOAL #4   Title pt to increase trunk mobility by >/= 10 degrees in all planes to with </= 6/10 pain to promote trunk mobility   Time 4   Period Weeks   Status New   Target Date 05/11/17           PT Long Term Goals - 04/13/17 1255      PT LONG TERM GOAL #1   Title pt to increase trunk flexion to >/= 40 degrees and extension/  bil side bending to >/= 18 degrees with </= 4/10 to promote functional trunk mobility    Time 8   Period Weeks   Status New   Target Date 06/08/17     PT LONG TERM GOAL #2   Title improve bil LE strength to >/=  4/5 in to provide hip/ knee stability with walking / standing for safety    Time 8   Period Weeks   Status New   Target Date 06/08/17     PT LONG TERM GOAL #3   Title pt to be able to stand / walk for >/= 45 min and navigate >/= 15 steps reciprocally with </= 5/10 pain for functional endurance for in home/ community ambulation    Time 8   Period Weeks   Status New   Target Date 06/08/17     PT LONG TERM GOAL #4   Title increase FOTO to </=49% limited to demo improvement in function    Time 8   Period Weeks   Status New   Target Date 06/08/17     PT LONG TERM GOAL #5   Title pt to be I with all HEP given as of last visit    Time 8   Period Weeks   Status New   Target Date 06/08/17               Plan - 04/14/17 1810    Clinical Impression Statement BERG mostly completed today.  She needs to test with eyes closed for  score. Manual and modalities were helpful to decrease pain.    PT Next Visit Plan assess/ review HEP and update PRN, Complete BERG balance, manual for low back/ hip, core work, hip strengthening, trial gentle manual traction only, hip mobs?    PT Home Exercise Plan lower trunk rotation, posterior pelvic tilt   Consulted and Agree with Plan of Care Patient      Patient will benefit from skilled therapeutic intervention in order to improve the following deficits and impairments:  Abnormal gait, Pain, Improper body mechanics, Postural dysfunction, Decreased endurance, Decreased activity tolerance, Decreased balance, Difficulty walking, Decreased strength, Decreased mobility, Increased fascial restricitons  Visit Diagnosis: Chronic bilateral low back pain with right-sided sciatica  Muscle weakness (generalized)  Muscle spasm of back  Other abnormalities of gait and mobility       G-Codes - 04-27-2017 1300    Functional Assessment Tool Used (Outpatient Only) ROM/ Strength/ pain / FOTO   Functional Limitation Changing and maintaining body position    Changing and Maintaining Body Position Current Status (Z6109) At least 60 percent but less than 80 percent impaired, limited or restricted   Changing and Maintaining Body Position Goal Status (U0454) At least 40 percent but less than 60 percent impaired, limited or restricted      Problem List There are no active problems to display for this patient.   HARRIS,KAREN PTA 04/14/2017, 6:13 PM  [facility] [address] [place], [place], [address] Phone: [phone]   Fax:  [phone]  Name: Zayna Toste MRN: [id number] Date of Birth: [birth year]

## 2017-04-19 ENCOUNTER — Encounter: Payer: Self-pay | Admitting: Physical Therapy

## 2017-04-19 ENCOUNTER — Ambulatory Visit: Payer: Medicare HMO | Admitting: Physical Therapy

## 2017-04-19 DIAGNOSIS — R2689 Other abnormalities of gait and mobility: Secondary | ICD-10-CM

## 2017-04-19 DIAGNOSIS — M5441 Lumbago with sciatica, right side: Secondary | ICD-10-CM | POA: Diagnosis not present

## 2017-04-19 DIAGNOSIS — M6281 Muscle weakness (generalized): Secondary | ICD-10-CM

## 2017-04-19 DIAGNOSIS — G8929 Other chronic pain: Secondary | ICD-10-CM

## 2017-04-19 DIAGNOSIS — M62830 Muscle spasm of back: Secondary | ICD-10-CM

## 2017-04-19 NOTE — Therapy (Signed)
[facility] [address] [place], [place], [address] Phone: [phone]   Fax:  [phone]  Physical Therapy Treatment  Patient Details  Name: Jamie Garner MRN: [id number] Date of Birth: 1971-03-19 Referring Provider: Vira Browns MD  Encounter Date: 04/19/2017      PT End of Session - 04/19/17 1825    Visit Number 3   Number of Visits 17   Date for PT Re-Evaluation 06/08/17   PT Start Time 1547   PT Stop Time 1645   PT Time Calculation (min) 58 min   Activity Tolerance Patient tolerated treatment well;Patient limited by pain   Behavior During Therapy [facility] for tasks assessed/performed      Past Medical History:  Diagnosis Date  . Anxiety   . Back pain, chronic   . Depression   . Hormone disorder   . Thyroid disease     Past Surgical History:  Procedure Laterality Date  . BACK SURGERY  2011, 2013, 2015   x3  . HEMORRHOID SURGERY  2017  . PELVIC LAPAROSCOPY     ovarian cyst removed  . TUBAL LIGATION      There were no vitals filed for this visit.      Subjective Assessment - 04/19/17 1554    Subjective 7/10 pain.I have see a little improvement with going to bed at 1:30 in am vs 3:30 am.     Patient is accompained by: Interpreter   Currently in Pain? Yes   Pain Score 7    Pain Location Back   Pain Orientation Right;Left;Posterior;Lower   Pain Descriptors / Indicators Stabbing  and scraping the whole area into her leg.     Pain Radiating Towards to the heel   Pain Frequency Constant   Aggravating Factors  ADL's , everything makes it worse.,  keeps her awake and has insominia.   Pain Relieving Factors bath, laying down shower,  sitting,  rest,  medication prescribed.    Effect of Pain on Daily Activities limits ADL'd.                           [facility] Adult PT Treatment/Exercise - 04/19/17 0001      Berg Balance Test   Standing Unsupported with Eyes Closed Able to stand 10  seconds safely     Lumbar Exercises: Stretches   Lower Trunk Rotation 3 reps;10 seconds   Lower Trunk Rotation Limitations painful to the right   Pelvic Tilt 5 reps     Lumbar Exercises: Supine   Clam Limitations painful to right ,  OK oif 2 inches only with tilt.     Heel Slides 5 reps   Heel Slides Limitations pan with right leg slides   Bent Knee Raise Limitations  left OK with bent tknee lifts   Bridge 10 reps   Straight Leg Raise 10 reps  left     Moist Heat Therapy   Number Minutes Moist Heat 15 Minutes   Moist Heat Location Lumbar Spine     Electrical Stimulation   Electrical Stimulation Location lumbar/ glutera   Electrical Stimulation Action IFC   Electrical Stimulation Parameters to tolerance   Electrical Stimulation Goals Pain  concurrent with moist heat     Manual Therapy   Manual Therapy --  Jip inferior glides . joint mobilization.   Manual therapy comments distractin helped ease pain left,  not tolerated on right.  PT Short Term Goals - 04/13/17 1253      PT SHORT TERM GOAL #1   Title pt to be I with intial HEP    Time 4   Period Weeks   Status New   Target Date 05/11/17     PT SHORT TERM GOAL #2   Title pt to demo proper posture and lifting mechanics to prevent and reduce low back pain   Time 4   Period Weeks   Status New     PT SHORT TERM GOAL #3   Title pt to reduce muscle spasm int he low back / hip to reduce pain to </= 6/10 and promote trunk and hip mobility    Time 4   Period Weeks   Status New   Target Date 05/11/17     PT SHORT TERM GOAL #4   Title pt to increase trunk mobility by >/= 10 degrees in all planes to with </= 6/10 pain to promote trunk mobility   Time 4   Period Weeks   Status New   Target Date 05/11/17           PT Long Term Goals - 04/13/17 1255      PT LONG TERM GOAL #1   Title pt to increase trunk flexion to >/= 40 degrees and extension/  bil side bending to >/= 18 degrees with  </= 4/10 to promote functional trunk mobility    Time 8   Period Weeks   Status New   Target Date 06/08/17     PT LONG TERM GOAL #2   Title improve bil LE strength to >/= 4/5 in to provide hip/ knee stability with walking / standing for safety    Time 8   Period Weeks   Status New   Target Date 06/08/17     PT LONG TERM GOAL #3   Title pt to be able to stand / walk for >/= 45 min and navigate >/= 15 steps reciprocally with </= 5/10 pain for functional endurance for in home/ community ambulation    Time 8   Period Weeks   Status New   Target Date 06/08/17     PT LONG TERM GOAL #4   Title increase FOTO to </=49% limited to demo improvement in function    Time 8   Period Weeks   Status New   Target Date 06/08/17     PT LONG TERM GOAL #5   Title pt to be I with all HEP given as of last visit    Time 8   Period Weeks   Status New   Target Date 06/08/17               Plan - 04/19/17 1825    Clinical Impression Statement BERG 51/56.  Pan increased intermittantly during exercises.  Focus on stabilization without increasing pain.  Hip distraction in 90/90 on the left helped the most,  it was not tolerated on the right.    PT Treatment/Interventions ADLs/Self Care Home Management;Moist Heat;Therapeutic activities;Therapeutic exercise;Dry needling;Taping;Manual techniques;Cryotherapy;Electrical Stimulation;Iontophoresis 4mg /ml Dexamethasone;Ultrasound;Passive range of motion;Neuromuscular re-education;Balance training;Gait training;Functional mobility training;Patient/family education;Traction   PT Next Visit Plan assess/ review HEP and update PRN,  PT to discuss the results of the BERG , manual for low back/ hip, core work, hip strengthening, trial gentle manual traction only, hip mobs Left    PT Home Exercise Plan lower trunk rotation, posterior pelvic tilt   Consulted and Agree with Plan of Care Patient  Patient will benefit from skilled therapeutic intervention in  order to improve the following deficits and impairments:  Abnormal gait, Pain, Improper body mechanics, Postural dysfunction, Decreased endurance, Decreased activity tolerance, Decreased balance, Difficulty walking, Decreased strength, Decreased mobility, Increased fascial restricitons  Visit Diagnosis: Chronic bilateral low back pain with right-sided sciatica  Muscle weakness (generalized)  Muscle spasm of back  Other abnormalities of gait and mobility     Problem List There are no active problems to display for this patient.   HARRIS,KAREN PTA 04/19/2017, 6:28 PM  [facility] [address] [place], [place], [address] Phone: [phone]   Fax:  [phone]  Name: Homero FellersRosa H Olivo Garner MRN: [id number] Date of Birth: 1971/07/29

## 2017-04-21 ENCOUNTER — Ambulatory Visit: Payer: Medicare HMO | Admitting: Physical Therapy

## 2017-04-21 DIAGNOSIS — G8929 Other chronic pain: Secondary | ICD-10-CM

## 2017-04-21 DIAGNOSIS — M5441 Lumbago with sciatica, right side: Secondary | ICD-10-CM | POA: Diagnosis not present

## 2017-04-21 DIAGNOSIS — M6281 Muscle weakness (generalized): Secondary | ICD-10-CM

## 2017-04-21 DIAGNOSIS — M62830 Muscle spasm of back: Secondary | ICD-10-CM

## 2017-04-21 DIAGNOSIS — R2689 Other abnormalities of gait and mobility: Secondary | ICD-10-CM

## 2017-04-21 NOTE — Therapy (Signed)
[facility] [address] [place], [place], [address] Phone: [phone]   Fax:  [phone]  Physical Therapy Treatment  Patient Details  Name: Jamie FellersRosa H Olivo Garner MRN: [id number] Date of Birth: September 25, 1970 Referring Provider: Vira BrownsJames Nitka MD  Encounter Date: 04/21/2017      PT End of Session - 04/21/17 1306    Visit Number 4   Number of Visits 17   Date for PT Re-Evaluation 06/08/17   Authorization Type MCR: KX mod by 15th visit, Progress not by 10th visit or 05/14/2017.    PT Start Time 1300   PT Stop Time 1355   PT Time Calculation (min) 55 min      Past Medical History:  Diagnosis Date  . Anxiety   . Back pain, chronic   . Depression   . Hormone disorder   . Thyroid disease     Past Surgical History:  Procedure Laterality Date  . BACK SURGERY  2011, 2013, 2015   x3  . HEMORRHOID SURGERY  2017  . PELVIC LAPAROSCOPY     ovarian cyst removed  . TUBAL LIGATION      There were no vitals filed for this visit.      Subjective Assessment - 04/21/17 1304    Currently in Pain? Yes   Pain Score 6    Pain Location Back   Pain Orientation Right;Left;Lower;Posterior   Pain Descriptors / Indicators Stabbing   Pain Radiating Towards around front of hip and down right leg                          [facility] Adult PT Treatment/Exercise - 04/21/17 0001      Self-Care   Self-Care --     Therapeutic Activites    Therapeutic Activities ADL's   ADL's Pt returned demonstration of proper proper and stance for vacuming, sweeping mopping, also squating to touch stool x 4 with cues for abdomianl draw in with all ADLs and squatting.      Lumbar Exercises: Stretches   Single Knee to Chest Stretch 2 reps;30 seconds   Single Knee to Chest Stretch Limitations felt relief    Lower Trunk Rotation 3 reps;10 seconds   Lower Trunk Rotation Limitations no c/o pain     Pelvic Tilt Limitations 10 reps 5 sec      Lumbar  Exercises: Aerobic   Stationary Bike Nustep L5 Ue/LE x 5 minutes     Lumbar Exercises: Supine   Clam 10 reps   Clam Limitations bilateral and unilateral    Heel Slides 10 reps   Bent Knee Raise 10 reps   Straight Leg Raise 10 reps   Straight Leg Raises Limitations each , small ROM      Moist Heat Therapy   Number Minutes Moist Heat 15 Minutes   Moist Heat Location Lumbar Spine     Electrical Stimulation   Electrical Stimulation Location lumbar/ gluteal right    Electrical Stimulation Action IFC x 15 min   Electrical Stimulation Parameters 18ma   Electrical Stimulation Goals Pain  concurrent with moist heat                PT Education - 04/21/17 1354    Education provided Yes   Education Details HEP   Person(s) Educated Patient;Other (comment)  interpreter   Methods Explanation;Handout   Comprehension Verbalized understanding          PT Short Term Goals - 04/13/17  1253      PT SHORT TERM GOAL #1   Title pt to be I with intial HEP    Time 4   Period Weeks   Status New   Target Date 05/11/17     PT SHORT TERM GOAL #2   Title pt to demo proper posture and lifting mechanics to prevent and reduce low back pain   Time 4   Period Weeks   Status New     PT SHORT TERM GOAL #3   Title pt to reduce muscle spasm int he low back / hip to reduce pain to </= 6/10 and promote trunk and hip mobility    Time 4   Period Weeks   Status New   Target Date 05/11/17     PT SHORT TERM GOAL #4   Title pt to increase trunk mobility by >/= 10 degrees in all planes to with </= 6/10 pain to promote trunk mobility   Time 4   Period Weeks   Status New   Target Date 05/11/17           PT Long Term Goals - 04/13/17 1255      PT LONG TERM GOAL #1   Title pt to increase trunk flexion to >/= 40 degrees and extension/  bil side bending to >/= 18 degrees with </= 4/10 to promote functional trunk mobility    Time 8   Period Weeks   Status New   Target Date 06/08/17      PT LONG TERM GOAL #2   Title improve bil LE strength to >/= 4/5 in to provide hip/ knee stability with walking / standing for safety    Time 8   Period Weeks   Status New   Target Date 06/08/17     PT LONG TERM GOAL #3   Title pt to be able to stand / walk for >/= 45 min and navigate >/= 15 steps reciprocally with </= 5/10 pain for functional endurance for in home/ community ambulation    Time 8   Period Weeks   Status New   Target Date 06/08/17     PT LONG TERM GOAL #4   Title increase FOTO to </=49% limited to demo improvement in function    Time 8   Period Weeks   Status New   Target Date 06/08/17     PT LONG TERM GOAL #5   Title pt to be I with all HEP given as of last visit    Time 8   Period Weeks   Status New   Target Date 06/08/17               Plan - 04/21/17 1357    Clinical Impression Statement Improved tolerance to therex. BEgan Nustep without increased pain.  Return demonstration for body mechanics with ADLS. IFC /HMP at end of session for pain. Printed entire HEP for patient.    PT Next Visit Plan assess/ review HEP and update PRN,  PT to discuss the results of the BERG , manual for low back/ hip, core work, hip strengthening, trial gentle manual traction only, hip mobs Left    PT Home Exercise Plan lower trunk rotation, posterior pelvic tilt, SKTC, supine marching and heel slides with bracing    Consulted and Agree with Plan of Care Patient      Patient will benefit from skilled therapeutic intervention in order to improve the following deficits and impairments:  Abnormal gait, Pain, Improper  body mechanics, Postural dysfunction, Decreased endurance, Decreased activity tolerance, Decreased balance, Difficulty walking, Decreased strength, Decreased mobility, Increased fascial restricitons  Visit Diagnosis: Chronic bilateral low back pain with right-sided sciatica  Muscle weakness (generalized)  Muscle spasm of back  Other abnormalities of gait and  mobility     Problem List There are no active problems to display for this patient.   Jamie Garner, [place] 04/21/2017, 1:58 PM  [facility] [address] [place], [place], [address] Phone: [phone]   Fax:  [phone]  Name: Letisha Yera MRN: [id number] Date of Birth: February 25, 1971

## 2017-04-26 ENCOUNTER — Ambulatory Visit: Payer: Medicare HMO | Admitting: Physical Therapy

## 2017-04-28 ENCOUNTER — Encounter: Payer: Self-pay | Admitting: Physical Therapy

## 2017-04-28 ENCOUNTER — Ambulatory Visit: Payer: Medicare HMO | Admitting: Physical Therapy

## 2017-04-28 DIAGNOSIS — M5441 Lumbago with sciatica, right side: Secondary | ICD-10-CM | POA: Diagnosis not present

## 2017-04-28 DIAGNOSIS — M6281 Muscle weakness (generalized): Secondary | ICD-10-CM

## 2017-04-28 DIAGNOSIS — M62830 Muscle spasm of back: Secondary | ICD-10-CM

## 2017-04-28 DIAGNOSIS — R2689 Other abnormalities of gait and mobility: Secondary | ICD-10-CM

## 2017-04-28 DIAGNOSIS — G8929 Other chronic pain: Secondary | ICD-10-CM

## 2017-04-28 NOTE — Therapy (Signed)
[facility] [address] [place], [place], [address] Phone: [phone]   Fax:  [phone]  Physical Therapy Treatment  Patient Details  Name: Jamie Garner MRN: [id number] Date of Birth: 02/28/1971 Referring Provider: Vira Browns MD  Encounter Date: 04/28/2017      PT End of Session - 04/28/17 1309    Visit Number 5   Number of Visits 17   Date for PT Re-Evaluation 06/08/17   PT Start Time 0847   PT Stop Time 0943   PT Time Calculation (min) 56 min   Activity Tolerance Patient tolerated treatment well   Behavior During Therapy [facility] for tasks assessed/performed      Past Medical History:  Diagnosis Date  . Anxiety   . Back pain, chronic   . Depression   . Hormone disorder   . Thyroid disease     Past Surgical History:  Procedure Laterality Date  . BACK SURGERY  2011, 2013, 2015   x3  . HEMORRHOID SURGERY  2017  . PELVIC LAPAROSCOPY     ovarian cyst removed  . TUBAL LIGATION      There were no vitals filed for this visit.      Subjective Assessment - 04/28/17 0854    Subjective Misses 1 day earlier this week due to increased back pain.  She did not try to do exerces that day.  She did her exercises yesterday.   Patient is accompained by: Interpreter   Currently in Pain? Yes   Pain Score 6    Pain Location Back   Pain Orientation Right   Pain Descriptors / Indicators Stabbing   Pain Type Chronic pain   Pain Radiating Towards lower leg   Pain Frequency Constant   Aggravating Factors  Wakes her up. Limits ADL.    Pain Relieving Factors bath, heat,  laying down, sitting rest,  medications.    Effect of Pain on Daily Activities Limits AFDL and sleeping.  Limits driving due to pain   Multiple Pain Sites No                         [facility] Adult PT Treatment/Exercise - 04/28/17 0001      Lumbar Exercises: Stretches   Passive Hamstring Stretch 3 reps;30 seconds  left   Single Knee  to Chest Stretch 5 reps;10 seconds   Single Knee to Chest Stretch Limitations feels good  more flexible today   Lower Trunk Rotation Limitations 10 x painful to right , cued for pain free   Pelvic Tilt Limitations 10 reps 5 sec    Quad Stretch 3 reps;30 seconds   Quad Stretch Limitations HEP.  some soft tissue strumming during stretch     Lumbar Exercises: Supine   Clam 10 reps   Bent Knee Raise 10 reps     Moist Heat Therapy   Number Minutes Moist Heat 15 Minutes   Moist Heat Location Lumbar Spine     Electrical Stimulation   Electrical Stimulation Location lumbar/gluteal   Electrical Stimulation Action IFC   Electrical Stimulation Parameters 19 mA   Electrical Stimulation Goals Pain  concurrent with moist heat     Manual Therapy   Manual therapy comments hip mobs hip/knee 90/90 3 positiond left only.  this felt good.                PT Education - 04/28/17 [phone]    Education provided Yes   Education  Details HEP   Person(s) Educated Patient   Methods Explanation;Tactile cues;Verbal cues;Handout   Comprehension Verbalized understanding;Returned demonstration          PT Short Term Goals - 04/28/17 0910      PT SHORT TERM GOAL #1   Title pt to be I with intial HEP    Baseline independent   Time 4   Period Weeks   Status Achieved     PT SHORT TERM GOAL #3   Title pt to reduce muscle spasm int he low back / hip to reduce pain to </= 6/10 and promote trunk and hip mobility    Baseline spasm pain varied form moderate to severe   Time 4   Period Weeks           PT Long Term Goals - 04/13/17 1255      PT LONG TERM GOAL #1   Title pt to increase trunk flexion to >/= 40 degrees and extension/  bil side bending to >/= 18 degrees with </= 4/10 to promote functional trunk mobility    Time 8   Period Weeks   Status New   Target Date 06/08/17     PT LONG TERM GOAL #2   Title improve bil LE strength to >/= 4/5 in to provide hip/ knee stability with walking  / standing for safety    Time 8   Period Weeks   Status New   Target Date 06/08/17     PT LONG TERM GOAL #3   Title pt to be able to stand / walk for >/= 45 min and navigate >/= 15 steps reciprocally with </= 5/10 pain for functional endurance for in home/ community ambulation    Time 8   Period Weeks   Status New   Target Date 06/08/17     PT LONG TERM GOAL #4   Title increase FOTO to </=49% limited to demo improvement in function    Time 8   Period Weeks   Status New   Target Date 06/08/17     PT LONG TERM GOAL #5   Title pt to be I with all HEP given as of last visit    Time 8   Period Weeks   Status New   Target Date 06/08/17               Plan - 04/28/17 1310    Clinical Impression Statement Patient is less tight and guarded today.  She has a pain flare this week so missed one of her visits.  Pain 5/10.   She understands posture and ADL techniques.  She will squate to get things off floor.   She does not carry groceries or do laundry tasks.    PT Next Visit Plan assess/ review HEP and update PRN,  PT to discuss the results of the BERG , manual for low back/ hip, core work, hip strengthening, trial gentle manual traction only, hip mobs Left    PT Home Exercise Plan lower trunk rotation, posterior pelvic tilt, SKTC, supine marching and heel slides with bracing Quad stretch left   Consulted and Agree with Plan of Care Patient      Patient will benefit from skilled therapeutic intervention in order to improve the following deficits and impairments:  Abnormal gait, Pain, Improper body mechanics, Postural dysfunction, Decreased endurance, Decreased activity tolerance, Decreased balance, Difficulty walking, Decreased strength, Decreased mobility, Increased fascial restricitons  Visit Diagnosis: Chronic bilateral low back pain with right-sided sciatica  Muscle weakness (generalized)  Muscle spasm of back  Other abnormalities of gait and mobility     Problem  List There are no active problems to display for this patient.   Namiyah Grantham PTA 04/28/2017, 1:15 PM  [facility] [address] [place], [place], [address] Phone: [phone]   Fax:  [phone]  Name: Jamie Garner MRN: [id number] Date of Birth: 1971-03-13

## 2017-04-28 NOTE — Patient Instructions (Signed)
Hip Flexor Stretch    Acostado sobre la espalda cerca del borde de la cama, doble una pierna apoyando el pie sobre la cama. Deje la otra pierna colgando del borde, relajada, con el muslo totalmente apoyado sobre la cama. Repita __3__ veces. Haga ___1_ sesiones por da. 30 segundos. Ejercicio avanzado: Doble la rodilla hacia atrs manteniendo el muslo en contacto con la cama.  [email or web address].[email or web address]   Copyright  VHI. All rights reserved.

## 2017-05-03 ENCOUNTER — Encounter: Payer: Medicare HMO | Admitting: Physical Therapy

## 2017-05-05 ENCOUNTER — Encounter: Payer: Self-pay | Admitting: Physical Therapy

## 2017-05-05 ENCOUNTER — Ambulatory Visit: Payer: Medicare HMO | Admitting: Physical Therapy

## 2017-05-05 DIAGNOSIS — M5441 Lumbago with sciatica, right side: Secondary | ICD-10-CM | POA: Diagnosis not present

## 2017-05-05 DIAGNOSIS — R2689 Other abnormalities of gait and mobility: Secondary | ICD-10-CM

## 2017-05-05 DIAGNOSIS — G8929 Other chronic pain: Secondary | ICD-10-CM

## 2017-05-05 DIAGNOSIS — M6281 Muscle weakness (generalized): Secondary | ICD-10-CM

## 2017-05-05 DIAGNOSIS — M62830 Muscle spasm of back: Secondary | ICD-10-CM

## 2017-05-05 NOTE — Therapy (Signed)
[place], [place], [address] Phone: [phone]   Fax:  [phone]  Physical Therapy Treatment  Patient Details  Name: Jamie Garner MRN: [id number] Date of Birth: [birth year] Referring Provider: Basil Dess MD  Encounter Date: 05/05/2017      PT End of Session - 05/05/17 0919    Visit Number 6   Number of Visits 17   Date for PT Re-Evaluation 06/08/17   Authorization Type MCR: KX mod by 15th visit, Progress not by 10th visit or 05/14/2017.    PT Start Time [phone]   PT Stop Time 0944   PT Time Calculation (min) 57 min   Activity Tolerance Patient tolerated treatment well   Behavior During Therapy [facility] for tasks assessed/performed      Past Medical History:  Diagnosis Date  . Anxiety   . Back pain, chronic   . Depression   . Hormone disorder   . Thyroid disease     Past Surgical History:  Procedure Laterality Date  . BACK SURGERY  2011, 2013, 2015   x3  . HEMORRHOID SURGERY  2017  . PELVIC LAPAROSCOPY     ovarian cyst removed  . TUBAL LIGATION      There were no vitals filed for this visit.      Subjective Assessment - 05/05/17 0852    Subjective "I am doing better today no pain, and I haven't had any falls"    Currently in Pain? No/denies   Pain Score 0-No pain   Aggravating Factors  waking up in the morning,    Pain Relieving Factors exercise, bath, heat, laying down, sitting/ resting                         [facility] Adult PT Treatment/Exercise - 05/05/17 0906      Lumbar Exercises: Stretches   Active Hamstring Stretch 3 reps;30 seconds  contract/ relax with 10 sec contraction   Lower Trunk Rotation --  2 x 10 holding end rang x 5 sec     Lumbar Exercises: Supine   Bent Knee Raise 10 reps  alternating table top position x 2 sets   Straight Leg Raise 15 reps  on RLE x 2 sets     Moist Heat Therapy   Number Minutes Moist Heat 10 Minutes   Moist Heat  Location Lumbar Spine     Electrical Stimulation   Electrical Stimulation Location lumbar/gluteal   Electrical Stimulation Action IFC   Electrical Stimulation Parameters L15 x 10 min, scan 100%    Electrical Stimulation Goals Pain     Manual Therapy   Manual Therapy Joint mobilization;Muscle Energy Technique   Joint Mobilization R long axis distraction grade 5, PA glides grade 3    Muscle Energy Technique scissor technique with resisted R hip flexion and L hip extension 6 x 10 sec hold                PT Education - 05/05/17 0918    Education provided Yes   Education Details anatomy of the SIJ and effects of surrounding muscles and benefits of exercise  extra time needed for interpretation   Person(s) Educated Patient   Methods Explanation;Verbal cues   Comprehension Verbalized understanding;Verbal cues required          PT Short Term Goals - 04/28/17 0910      PT SHORT TERM GOAL #1   Title pt to be I  with intial HEP    Baseline independent   Time 4   Period Weeks   Status Achieved     PT SHORT TERM GOAL #3   Title pt to reduce muscle spasm int he low back / hip to reduce pain to </= 6/10 and promote trunk and hip mobility    Baseline spasm pain varied form moderate to severe   Time 4   Period Weeks           PT Long Term Goals - 04/13/17 1255      PT LONG TERM GOAL #1   Title pt to increase trunk flexion to >/= 40 degrees and extension/  bil side bending to >/= 18 degrees with </= 4/10 to promote functional trunk mobility    Time 8   Period Weeks   Status New   Target Date 06/08/17     PT LONG TERM GOAL #2   Title improve bil LE strength to >/= 4/5 in to provide hip/ knee stability with walking / standing for safety    Time 8   Period Weeks   Status New   Target Date 06/08/17     PT LONG TERM GOAL #3   Title pt to be able to stand / walk for >/= 45 min and navigate >/= 15 steps reciprocally with </= 5/10 pain for functional endurance for in  home/ community ambulation    Time 8   Period Weeks   Status New   Target Date 06/08/17     PT LONG TERM GOAL #4   Title increase FOTO to </=49% limited to demo improvement in function    Time 8   Period Weeks   Status New   Target Date 06/08/17     PT LONG TERM GOAL #5   Title pt to be I with all HEP given as of last visit    Time 8   Period Weeks   Status New   Target Date 06/08/17               Plan - 05/05/17 0920    Clinical Impression Statement pt reported no pain and hasn't had any falls. Focused on SIJ due to pt demonstrating posterior innomninate rotation. following MET with resisted hip flexion, and hamstring stretching. she reported decreased stiffness in the low back. continued core strengthening which she did well with.    PT Next Visit Plan posterior innomninate rotation on r, manual for low back/ hip, core work, hip strengthening, trial gentle manual traction only, hip mobs Left    PT Home Exercise Plan lower trunk rotation, posterior pelvic tilt, SKTC, supine marching and heel slides with bracing Quad stretch left   Consulted and Agree with Plan of Care Patient      Patient will benefit from skilled therapeutic intervention in order to improve the following deficits and impairments:  Abnormal gait, Pain, Improper body mechanics, Postural dysfunction, Decreased endurance, Decreased activity tolerance, Decreased balance, Difficulty walking, Decreased strength, Decreased mobility, Increased fascial restricitons  Visit Diagnosis: Chronic bilateral low back pain with right-sided sciatica  Muscle weakness (generalized)  Muscle spasm of back  Other abnormalities of gait and mobility     Problem List There are no active problems to display for this patient.  Starr Lake PT, DPT, LAT, ATC  05/05/17  9:33 AM      [facility] [address] [place], [place], [address] Phone: [phone]   Fax:   [phone]  Name: Jamie Garner MRN: [id number] Date of Birth: Dec 27, 1970

## 2017-05-11 ENCOUNTER — Ambulatory Visit: Payer: Medicare HMO | Attending: Specialist | Admitting: Physical Therapy

## 2017-05-11 DIAGNOSIS — R2689 Other abnormalities of gait and mobility: Secondary | ICD-10-CM | POA: Insufficient documentation

## 2017-05-11 DIAGNOSIS — G8929 Other chronic pain: Secondary | ICD-10-CM | POA: Insufficient documentation

## 2017-05-11 DIAGNOSIS — M62830 Muscle spasm of back: Secondary | ICD-10-CM | POA: Insufficient documentation

## 2017-05-11 DIAGNOSIS — M6281 Muscle weakness (generalized): Secondary | ICD-10-CM | POA: Insufficient documentation

## 2017-05-11 DIAGNOSIS — M5441 Lumbago with sciatica, right side: Secondary | ICD-10-CM | POA: Insufficient documentation

## 2017-05-13 ENCOUNTER — Telehealth: Payer: Self-pay | Admitting: Physical Therapy

## 2017-05-13 ENCOUNTER — Ambulatory Visit: Payer: Medicare HMO | Admitting: Physical Therapy

## 2017-05-13 NOTE — Telephone Encounter (Signed)
Using Interpreter, LVM regarding today's missed appointment. Reminder of her next scheduled appointment on Monday 9/10 at 8am. If she is unable to attend to call and cancel or reschedule.

## 2017-05-17 ENCOUNTER — Ambulatory Visit: Payer: Medicare HMO | Attending: Specialist | Admitting: Physical Therapy

## 2017-05-17 ENCOUNTER — Encounter: Payer: Self-pay | Admitting: Physical Therapy

## 2017-05-17 DIAGNOSIS — R2689 Other abnormalities of gait and mobility: Secondary | ICD-10-CM | POA: Diagnosis present

## 2017-05-17 DIAGNOSIS — M62830 Muscle spasm of back: Secondary | ICD-10-CM | POA: Diagnosis present

## 2017-05-17 DIAGNOSIS — M6281 Muscle weakness (generalized): Secondary | ICD-10-CM | POA: Diagnosis present

## 2017-05-17 DIAGNOSIS — M5441 Lumbago with sciatica, right side: Secondary | ICD-10-CM | POA: Diagnosis not present

## 2017-05-17 DIAGNOSIS — G8929 Other chronic pain: Secondary | ICD-10-CM | POA: Diagnosis present

## 2017-05-17 NOTE — Patient Instructions (Signed)
Over Head Pull: Narrow Grip       10  X              Side Pull: Double Arm   10 x_        Shoulder Rotation: Double Arm  10 X

## 2017-05-17 NOTE — Therapy (Signed)
[facility] [address] [place], [place], [address] Phone: [phone]   Fax:  [phone]  Physical Therapy Treatment  Patient Details  Name: Akaysha Cobern MRN: [id number] Date of Birth: 1971-04-30 Referring Provider: Vira Browns MD  Encounter Date: 05/17/2017      PT End of Session - 05/17/17 1144    Visit Number 7   Number of Visits 17   Date for PT Re-Evaluation 06/08/17   PT Start Time 0805   PT Stop Time 0905   PT Time Calculation (min) 60 min   Activity Tolerance Patient tolerated treatment well   Behavior During Therapy [facility] for tasks assessed/performed      Past Medical History:  Diagnosis Date  . Anxiety   . Back pain, chronic   . Depression   . Hormone disorder   . Thyroid disease     Past Surgical History:  Procedure Laterality Date  . BACK SURGERY  2011, 2013, 2015   x3  . HEMORRHOID SURGERY  2017  . PELVIC LAPAROSCOPY     ovarian cyst removed  . TUBAL LIGATION      There were no vitals filed for this visit.      Subjective Assessment - 05/17/17 0812    Subjective I had a bad weekend.  Mid back vs low back   Patient is accompained by: Interpreter   Currently in Pain? Yes   Pain Score 9    Pain Location Back   Pain Orientation Mid   Pain Descriptors / Indicators Pressure;Throbbing   Pain Type Chronic pain   Pain Frequency Constant   Aggravating Factors  always the same   Pain Relieving Factors tried being in bed doing nothing   Effect of Pain on Daily Activities Limits ADL.   Multiple Pain Sites No                         [facility] Adult PT Treatment/Exercise - 05/17/17 0001      Lumbar Exercises: Stretches   Lower Trunk Rotation 5 reps   Lower Trunk Rotation Limitations 5 X with breathing     Lumbar Exercises: Supine   Ab Set 5 reps   Clam 10 reps  with red band,  2 sets   Bent Knee Raise 10 reps   Straight Leg Raise 15 reps  on RLE x 2 sets   Other  Supine Lumbar Exercises Decompression with rolled towel aling spine starting lower thoracic to cervical     Shoulder Exercises: Supine   Horizontal ABduction 10 reps   Theraband Level (Shoulder Horizontal ABduction) Level 1 (Yellow)   Horizontal ABduction Limitations HEP   External Rotation 10 reps   Theraband Level (Shoulder External Rotation) Level 1 (Yellow)  with rolled towel along spine.   External Rotation Limitations HEP   Flexion 10 reps   Theraband Level (Shoulder Flexion) Level 1 (Yellow)   Flexion Limitations HEP     Moist Heat Therapy   Number Minutes Moist Heat 17 Minutes   Moist Heat Location Lumbar Spine;Other (comment)  Thoracic spine     Electrical Stimulation   Electrical Stimulation Location mid top lower back   Electrical Stimulation Action IFC   Electrical Stimulation Parameters 12.5   Electrical Stimulation Goals Pain                PT Education - 05/17/17 1144    Education provided Yes   Education Details HEP  Person(s) Educated Patient   Methods Explanation;Demonstration;Tactile cues;Verbal cues;Handout   Comprehension Verbalized understanding;Returned demonstration          PT Short Term Goals - 04/28/17 0910      PT SHORT TERM GOAL #1   Title pt to be I with intial HEP    Baseline independent   Time 4   Period Weeks   Status Achieved     PT SHORT TERM GOAL #3   Title pt to reduce muscle spasm int he low back / hip to reduce pain to </= 6/10 and promote trunk and hip mobility    Baseline spasm pain varied form moderate to severe   Time 4   Period Weeks           PT Long Term Goals - 04/13/17 1255      PT LONG TERM GOAL #1   Title pt to increase trunk flexion to >/= 40 degrees and extension/  bil side bending to >/= 18 degrees with </= 4/10 to promote functional trunk mobility    Time 8   Period Weeks   Status New   Target Date 06/08/17     PT LONG TERM GOAL #2   Title improve bil LE strength to >/= 4/5 in to provide  hip/ knee stability with walking / standing for safety    Time 8   Period Weeks   Status New   Target Date 06/08/17     PT LONG TERM GOAL #3   Title pt to be able to stand / walk for >/= 45 min and navigate >/= 15 steps reciprocally with </= 5/10 pain for functional endurance for in home/ community ambulation    Time 8   Period Weeks   Status New   Target Date 06/08/17     PT LONG TERM GOAL #4   Title increase FOTO to </=49% limited to demo improvement in function    Time 8   Period Weeks   Status New   Target Date 06/08/17     PT LONG TERM GOAL #5   Title pt to be I with all HEP given as of last visit    Time 8   Period Weeks   Status New   Target Date 06/08/17               Plan - 05/17/17 1145    Clinical Impression Statement Pain 4/10 at end of session.  Focus of pain mid back today may be due to muscle soreness,  Less rotation noted in innominate.  Keeping active and in motion encouraged.    PT Treatment/Interventions ADLs/Self Care Home Management;Moist Heat;Therapeutic activities;Therapeutic exercise;Dry needling;Taping;Manual techniques;Cryotherapy;Electrical Stimulation;Iontophoresis 4mg /ml Dexamethasone;Ultrasound;Passive range of motion;Neuromuscular re-education;Balance training;Gait training;Functional mobility training;Patient/family education;Traction   PT Next Visit Plan posterior innomninate rotation on r, manual for low back/ hip, core work, hip strengthening, trial gentle manual traction only, hip mobs Left Review supine scapular stabilization exercises   PT Home Exercise Plan lower trunk rotation, posterior pelvic tilt, SKTC, supine marching and heel slides with bracing Quad stretch left,  shoulder bands supine:  ER,  Narrow grip flexion,  Horizontal pulls.    Consulted and Agree with Plan of Care Patient      Patient will benefit from skilled therapeutic intervention in order to improve the following deficits and impairments:     Visit  Diagnosis: Chronic bilateral low back pain with right-sided sciatica  Muscle weakness (generalized)  Muscle spasm of back  Other abnormalities of  gait and mobility     Problem List There are no active problems to display for this patient.   HARRIS,KAREN PTA 05/17/2017, 11:49 AM  [facility] [address] [place], [place], [address] Phone: [phone]   Fax:  [phone]  Name: Azaliah Carrero MRN: [id number] Date of Birth: 1970/12/08

## 2017-05-17 NOTE — Therapy (Signed)
[place], [place], [address] Phone: [phone]   Fax:  [phone]  Physical Therapy Treatment  Patient Details  Name: Jamie Garner MRN: [id number] Date of Birth: October 19, 1970 Referring Provider: Basil Dess MD  Encounter Date: 05/17/2017      PT End of Session - 05/17/17 1144    Visit Number 7   Number of Visits 17   Date for PT Re-Evaluation 06/08/17   PT Start Time 0805   PT Stop Time 0905   PT Time Calculation (min) 60 min   Activity Tolerance Patient tolerated treatment well   Behavior During Therapy [facility] for tasks assessed/performed      Past Medical History:  Diagnosis Date  . Anxiety   . Back pain, chronic   . Depression   . Hormone disorder   . Thyroid disease     Past Surgical History:  Procedure Laterality Date  . BACK SURGERY  2011, 2013, 2015   x3  . HEMORRHOID SURGERY  2017  . PELVIC LAPAROSCOPY     ovarian cyst removed  . TUBAL LIGATION      There were no vitals filed for this visit.      Subjective Assessment - 05/17/17 0812    Subjective I had a bad weekend.  Mid back vs low back   Patient is accompained by: Interpreter   Currently in Pain? Yes   Pain Score 9    Pain Location Back   Pain Orientation Mid   Pain Descriptors / Indicators Pressure;Throbbing   Pain Type Chronic pain   Pain Frequency Constant   Aggravating Factors  always the same   Pain Relieving Factors tried being in bed doing nothing   Effect of Pain on Daily Activities Limits ADL.   Multiple Pain Sites No                         [facility] Adult PT Treatment/Exercise - 05/17/17 0001      Self-Care   Self-Care --  Keep moving vs laying in bed with pain increase,  walk, ex     Lumbar Exercises: Stretches   Lower Trunk Rotation 5 reps   Lower Trunk Rotation Limitations 5 X with breathing     Lumbar Exercises: Supine   Ab Set 5 reps   Clam 10 reps  with red band,  2  sets   Bent Knee Raise 10 reps   Straight Leg Raise 15 reps  on RLE x 2 sets   Other Supine Lumbar Exercises Decompression with rolled towel aling spine starting lower thoracic to cervical     Shoulder Exercises: Supine   Horizontal ABduction 10 reps   Theraband Level (Shoulder Horizontal ABduction) Level 1 (Yellow)   Horizontal ABduction Limitations HEP   External Rotation 10 reps   Theraband Level (Shoulder External Rotation) Level 1 (Yellow)  with rolled towel along spine.   External Rotation Limitations HEP   Flexion 10 reps   Theraband Level (Shoulder Flexion) Level 1 (Yellow)   Flexion Limitations HEP     Moist Heat Therapy   Number Minutes Moist Heat 17 Minutes   Moist Heat Location Lumbar Spine;Other (comment)  Thoracic spine     Electrical Stimulation   Electrical Stimulation Location mid top lower back   Electrical Stimulation Action IFC   Electrical Stimulation Parameters 12.5   Electrical Stimulation Goals Pain  PT Education - 05/17/17 1144    Education provided Yes   Education Details HEP   Person(s) Educated Patient   Methods Explanation;Demonstration;Tactile cues;Verbal cues;Handout   Comprehension Verbalized understanding;Returned demonstration          PT Short Term Goals - 05/17/17 1151      PT SHORT TERM GOAL #1   Title pt to be I with intial HEP    Baseline independent   Time 4   Period Weeks   Status Achieved     PT SHORT TERM GOAL #2   Title pt to demo proper posture and lifting mechanics to prevent and reduce low back pain   Baseline she is trying to use good posture at home   Time 4   Period Weeks   Status On-going     PT SHORT TERM GOAL #3   Title pt to reduce muscle spasm int he low back / hip to reduce pain to </= 6/10 and promote trunk and hip mobility    Baseline no spasm in low back (Achieved 09.10.2018)   Time 4   Period Weeks   Status Achieved     PT SHORT TERM GOAL #4   Title pt to increase trunk  mobility by >/= 10 degrees in all planes to with </= 6/10 pain to promote trunk mobility   Time 4   Period Weeks   Status Unable to assess           PT Long Term Goals - 04/13/17 1255      PT LONG TERM GOAL #1   Title pt to increase trunk flexion to >/= 40 degrees and extension/  bil side bending to >/= 18 degrees with </= 4/10 to promote functional trunk mobility    Time 8   Period Weeks   Status New   Target Date 06/08/17     PT LONG TERM GOAL #2   Title improve bil LE strength to >/= 4/5 in to provide hip/ knee stability with walking / standing for safety    Time 8   Period Weeks   Status New   Target Date 06/08/17     PT LONG TERM GOAL #3   Title pt to be able to stand / walk for >/= 45 min and navigate >/= 15 steps reciprocally with </= 5/10 pain for functional endurance for in home/ community ambulation    Time 8   Period Weeks   Status New   Target Date 06/08/17     PT LONG TERM GOAL #4   Title increase FOTO to </=49% limited to demo improvement in function    Time 8   Period Weeks   Status New   Target Date 06/08/17     PT LONG TERM GOAL #5   Title pt to be I with all HEP given as of last visit    Time 8   Period Weeks   Status New   Target Date 06/08/17               Plan - 05/17/17 1145    Clinical Impression Statement Pain 4/10 at end of session.  Focus of pain mid back today may be due to muscle soreness,  Less rotation noted in innominate.  Keeping active and in motion encouraged. STG# 3 met.   PT Treatment/Interventions ADLs/Self Care Home Management;Moist Heat;Therapeutic activities;Therapeutic exercise;Dry needling;Taping;Manual techniques;Cryotherapy;Electrical Stimulation;Iontophoresis 95m/ml Dexamethasone;Ultrasound;Passive range of motion;Neuromuscular re-education;Balance training;Gait training;Functional mobility training;Patient/family education;Traction   PT Next Visit Plan posterior  innomninate rotation on r, manual for low back/  hip, core work, hip strengthening, trial gentle manual traction only, hip mobs Left Review supine scapular stabilization exercises   PT Home Exercise Plan lower trunk rotation, posterior pelvic tilt, SKTC, supine marching and heel slides with bracing Quad stretch left,  shoulder bands supine:  ER,  Narrow grip flexion,  Horizontal pulls.    Consulted and Agree with Plan of Care Patient      Patient will benefit from skilled therapeutic intervention in order to improve the following deficits and impairments:     Visit Diagnosis: Chronic bilateral low back pain with right-sided sciatica  Muscle weakness (generalized)  Muscle spasm of back  Other abnormalities of gait and mobility     Problem List There are no active problems to display for this patient.   HARRIS,KAREN PTA 05/17/2017, 11:56 AM  [facility] [address] [place], [place], [address] Phone: [phone]   Fax:  [phone]  Name: Jamie Garner MRN: [id number] Date of Birth: [birth year]

## 2017-05-21 ENCOUNTER — Ambulatory Visit (INDEPENDENT_AMBULATORY_CARE_PROVIDER_SITE_OTHER): Payer: Medicare HMO | Admitting: Specialist

## 2017-05-25 ENCOUNTER — Encounter: Payer: Self-pay | Admitting: Physical Therapy

## 2017-05-25 ENCOUNTER — Ambulatory Visit: Payer: Medicare HMO | Admitting: Physical Therapy

## 2017-05-25 DIAGNOSIS — R2689 Other abnormalities of gait and mobility: Secondary | ICD-10-CM | POA: Diagnosis present

## 2017-05-25 DIAGNOSIS — G8929 Other chronic pain: Secondary | ICD-10-CM

## 2017-05-25 DIAGNOSIS — M6281 Muscle weakness (generalized): Secondary | ICD-10-CM | POA: Diagnosis present

## 2017-05-25 DIAGNOSIS — M62830 Muscle spasm of back: Secondary | ICD-10-CM

## 2017-05-25 DIAGNOSIS — M5441 Lumbago with sciatica, right side: Secondary | ICD-10-CM | POA: Diagnosis present

## 2017-05-25 NOTE — Therapy (Signed)
[place], [place], [address] Phone: [phone]   Fax:  [phone]  Physical Therapy Treatment  Patient Details  Name: Jamie Garner MRN: [id number] Date of Birth: 26-Oct-1970 Referring Provider: Basil Dess MD  Encounter Date: 05/25/2017      PT End of Session - 05/25/17 1305    Visit Number 8   Number of Visits 17   Date for PT Re-Evaluation 06/08/17   PT Start Time 1655  patient late short session ending with moist heat.  Charge will not equal time slot.    PT Stop Time 1117   PT Time Calculation (min) 48 min   Activity Tolerance Patient tolerated treatment well;Patient limited by pain   Behavior During Therapy [facility] for tasks assessed/performed      Past Medical History:  Diagnosis Date  . Anxiety   . Back pain, chronic   . Depression   . Hormone disorder   . Thyroid disease     Past Surgical History:  Procedure Laterality Date  . BACK SURGERY  2011, 2013, 2015   x3  . HEMORRHOID SURGERY  2017  . PELVIC LAPAROSCOPY     ovarian cyst removed  . TUBAL LIGATION      There were no vitals filed for this visit.      Subjective Assessment - 05/25/17 1037    Subjective 4/10 in low back.  I am doing well   Patient is accompained by: Interpreter   Currently in Pain? Yes   Pain Score 4   I feel good   Pain Location Back   Pain Orientation Lower   Pain Descriptors / Indicators Pressure   Pain Type Chronic pain   Pain Radiating Towards to posterior knee  intermittant,  groin sonetimes,  glutes sometimes   Aggravating Factors  Staying on feet all the time,  doing housework. stress   Pain Relieving Factors rest exercises   Effect of Pain on Daily Activities No during the day.  Wakes sometimes.            [facility] PT Assessment - 05/25/17 0001      AROM   Lumbar Flexion 35  no pain   Lumbar Extension 5  7/10 pain   Lumbar - Right Side Bend 15  ERP a little   Lumbar - Left  Side Bend 10  8/10 pain                     [facility] Adult PT Treatment/Exercise - 05/25/17 0001      Therapeutic Activites    Therapeutic Activities Other Therapeutic Activities   Other Therapeutic Activities simulated SI belt  with strap,  decreased pain from 7/10 to 5/10 with it's removal     Lumbar Exercises: Stretches   Passive Hamstring Stretch 1 rep;10 seconds   Passive Hamstring Stretch Limitations stopped due to pain   Lower Trunk Rotation 5 reps   Lower Trunk Rotation Limitations decreases pain     Lumbar Exercises: Supine   Bent Knee Raise 10 reps  no table top today   Bridge 10 reps   Bridge Limitations small lifts   Straight Leg Raise 15 reps     Moist Heat Therapy   Number Minutes Moist Heat 15 Minutes   Moist Heat Location Lumbar Spine     Manual Therapy   Manual therapy comments pelvis compression helpful with pain.    Joint Mobilization long axis distraction 3 X 15  seconds right.  able to decrease pain.                    PT Short Term Goals - 05/25/17 1311      PT SHORT TERM GOAL #1   Title pt to be I with intial HEP    Baseline independent   Time 4   Period Weeks   Status Achieved     PT SHORT TERM GOAL #2   Title pt to demo proper posture and lifting mechanics to prevent and reduce low back pain   Baseline she is trying to use good posture at home   Time 4   Period Weeks   Status Partially Met     PT SHORT TERM GOAL #3   Title pt to reduce muscle spasm int he low back / hip to reduce pain to </= 6/10 and promote trunk and hip mobility    Baseline no spasm in low back (Achieved 09.10.2018)   Time 4   Period Weeks   Status Achieved     PT SHORT TERM GOAL #4   Title pt to increase trunk mobility by >/= 10 degrees in all planes to with </= 6/10 pain to promote trunk mobility   Baseline not yet see flow sheets.   Time 4   Period Weeks   Status On-going           PT Long Term Goals - 05/25/17 1053      PT LONG  TERM GOAL #1   Title pt to increase trunk flexion to >/= 40 degrees and extension/  bil side bending to >/= 18 degrees with </= 4/10 to promote functional trunk mobility    Baseline 35 flexion trunk, o pain,  right side bend 15 degrees,  left sidebend 3 with 8/10 pain,  extension 10 degrees with 7/10 pain   Time 8   Period Weeks   Status On-going     PT LONG TERM GOAL #2   Title improve bil LE strength to >/= 4/5 in to provide hip/ knee stability with walking / standing for safety    Time 8   Period Weeks   Status Unable to assess     PT LONG TERM GOAL #3   Title pt to be able to stand / walk for >/= 45 min and navigate >/= 15 steps reciprocally with </= 5/10 pain for functional endurance for in home/ community ambulation    Baseline able  1/10 pain anterior   Time 8   Period Weeks   Status Achieved     PT LONG TERM GOAL #5   Title pt to be I with all HEP given as of last visit    Time 8   Period Weeks   Status Achieved               Plan - 05/25/17 1306    Clinical Impression Statement Patient was planning on stopping PT today because she was feeling pretty good(5/10 pain)  Pain increased to 8/10 with ROM trunk measurementrs.  She decided to make a few more appointments.  Core control with movement continues to bew a challange with spasms.  Short session  due to patient was late.FOTO 51% limitation . Pain 5/10 post exercise.   PT Next Visit Plan posterior innomninate rotation on r, manual for low back/ hip, core work, hip strengthening, trial gentle manual traction only, hip mobs Left Review supine scapular stabilization exercises.  Consider mechanical traction or  manual traction ? Hx of back surgery.,   PT Home Exercise Plan lower trunk rotation, posterior pelvic tilt, SKTC, supine marching and heel slides with bracing Quad stretch left,  shoulder bands supine:  ER,  Narrow grip flexion,  Horizontal pulls.    Consulted and Agree with Plan of Care Patient      Patient will  benefit from skilled therapeutic intervention in order to improve the following deficits and impairments:     Visit Diagnosis: Chronic bilateral low back pain with right-sided sciatica  Muscle weakness (generalized)  Muscle spasm of back  Other abnormalities of gait and mobility     Problem List There are no active problems to display for this patient.   Jamie Garner PTA 05/25/2017, 1:13 PM  [facility] [address] [place], [place], [address] Phone: [phone]   Fax:  [phone]  Name: Jamie Garner MRN: [id number] Date of Birth: Apr 29, 1971

## 2017-05-26 ENCOUNTER — Ambulatory Visit (INDEPENDENT_AMBULATORY_CARE_PROVIDER_SITE_OTHER): Payer: Medicare HMO | Admitting: Specialist

## 2017-05-26 ENCOUNTER — Ambulatory Visit (INDEPENDENT_AMBULATORY_CARE_PROVIDER_SITE_OTHER): Payer: Medicare HMO

## 2017-05-26 ENCOUNTER — Encounter (INDEPENDENT_AMBULATORY_CARE_PROVIDER_SITE_OTHER): Payer: Self-pay | Admitting: Specialist

## 2017-05-26 VITALS — BP 125/73 | HR 82 | Temp 97.9°F

## 2017-05-26 DIAGNOSIS — R200 Anesthesia of skin: Secondary | ICD-10-CM | POA: Diagnosis not present

## 2017-05-26 DIAGNOSIS — M4183 Other forms of scoliosis, cervicothoracic region: Secondary | ICD-10-CM | POA: Diagnosis not present

## 2017-05-26 DIAGNOSIS — M542 Cervicalgia: Secondary | ICD-10-CM

## 2017-05-26 DIAGNOSIS — R202 Paresthesia of skin: Secondary | ICD-10-CM | POA: Diagnosis not present

## 2017-05-26 DIAGNOSIS — M419 Scoliosis, unspecified: Secondary | ICD-10-CM | POA: Insufficient documentation

## 2017-05-26 DIAGNOSIS — M5440 Lumbago with sciatica, unspecified side: Secondary | ICD-10-CM | POA: Diagnosis not present

## 2017-05-26 MED ORDER — BACLOFEN 5 MG PO TABS: 2.0000 | ORAL_TABLET | Freq: Two times a day (BID) | ORAL | 3 refills | Status: DC

## 2017-05-26 MED ORDER — GABAPENTIN 400 MG PO CAPS: ORAL_CAPSULE | ORAL | 3 refills | Status: DC

## 2017-05-26 MED ORDER — METHYLPREDNISOLONE 4 MG PO TBPK: ORAL_TABLET | ORAL | 0 refills | Status: DC

## 2017-05-26 NOTE — Progress Notes (Signed)
Office Visit Note   Patient: Jamie Garner           Date of Birth: [birth year]           MRN: [id number] Visit Date: 05/26/2017              Requested by: Fleet Contras, MD [address] [place], [place] [address] PCP: Jackalyn Lombard I, MD   Assessment & Plan: Visit Diagnoses:  1. Cervicalgia   2. Low back pain with sciatica, sciatica laterality unspecified, unspecified back pain laterality, unspecified chronicity     Plan: Avoid overhead lifting and overhead use of the arms. Do not lift greater than 5 lbs. Adjust head rest in vehicle to prevent hyperextension if rear ended. Ice or moist heat can be of benefit. Walking to improve endurance to pain associated with neck and lumbar spine.  Dr. [place] Blas secretary will call to schedule you for EMG/NCV of the right arm. Follow-Up Instructions: No Follow-up on file.   Orders:  Orders Placed This Encounter  Procedures  . XR Cervical Spine 2 or 3 views   No orders of the defined types were placed in this encounter.     Procedures: No procedures performed   Clinical Data: No additional findings.   Subjective: No chief complaint on file.   46 year old female with history of low back pain s/p 2 level lumbar fusion L4-5 and L5-S1 seen 1 month ago and started a PT program with core strengthening and hamstring stretching and Lower extremity strengthening. She is experiencing some on going right lumbar and SI discomfort and pain radiating into the right groin. Worsening with bending and stooping and lifting.  No bowel or bladder difficulty. No pain with coughing or sneezing. She does not want any further surgery or removal or hardware right unilateral pedicle screws and rods. Presently now with 3 week history  Of numbness into the right hand the index and into the right little finger. Pain in the neck extension and flexion and side to side movement. She also has headaches. The headaches are usually posterior occipital  and into the posterior auricular areas bilateral. She has some weakness in the right arm and she does drop items. Told not to hold things with her hands because she drops items.     Review of Systems   Objective: Vital Signs: BP 125/73 (BP Location: Left Arm, Patient Position: Sitting, Cuff Size: Normal)   Pulse 82   Temp 97.9 F (36.6 C)   Physical Exam  Ortho Exam  Specialty Comments:  No specialty comments available.  Imaging: No results found.   PMFS History: There are no active problems to display for this patient.  Past Medical History:  Diagnosis Date  . Anxiety   . Back pain, chronic   . Depression   . Hormone disorder   . Thyroid disease     Family History  Problem Relation Age of Onset  . Breast cancer Maternal Grandmother   . Diabetes Maternal Grandfather     Past Surgical History:  Procedure Laterality Date  . BACK SURGERY  2011, 2013, 2015   x3  . HEMORRHOID SURGERY  2017  . PELVIC LAPAROSCOPY     ovarian cyst removed  . TUBAL LIGATION     Social History   Occupational History  . Not on file.   Social History Main Topics  . Smoking status: Never Smoker  . Smokeless tobacco: Never Used  . Alcohol use No  . Drug  use: No  . Sexual activity: Yes    Partners: Male

## 2017-05-26 NOTE — Patient Instructions (Addendum)
Avoid overhead lifting and overhead use of the arms. Do not lift greater than 5 lbs. Adjust head rest in vehicle to prevent hyperextension if rear ended. Ice or moist heat can be of benefit. Walking to improve endurance to pain associated with neck and lumbar spine.  Dr. [place] Blas secretary will call to schedule you for EMG/NCV of the right arm.

## 2017-06-01 ENCOUNTER — Ambulatory Visit: Payer: Medicare HMO | Admitting: Physical Therapy

## 2017-06-03 ENCOUNTER — Ambulatory Visit: Payer: Medicare HMO | Admitting: Physical Therapy

## 2017-06-08 ENCOUNTER — Encounter: Payer: Self-pay | Admitting: Physical Therapy

## 2017-06-08 ENCOUNTER — Ambulatory Visit: Payer: Medicare HMO | Attending: Specialist | Admitting: Physical Therapy

## 2017-06-08 DIAGNOSIS — M62830 Muscle spasm of back: Secondary | ICD-10-CM | POA: Diagnosis present

## 2017-06-08 DIAGNOSIS — M6281 Muscle weakness (generalized): Secondary | ICD-10-CM | POA: Insufficient documentation

## 2017-06-08 DIAGNOSIS — M5441 Lumbago with sciatica, right side: Secondary | ICD-10-CM | POA: Insufficient documentation

## 2017-06-08 DIAGNOSIS — R2689 Other abnormalities of gait and mobility: Secondary | ICD-10-CM | POA: Diagnosis present

## 2017-06-08 DIAGNOSIS — G8929 Other chronic pain: Secondary | ICD-10-CM

## 2017-06-08 NOTE — Therapy (Signed)
[facility] [address] [place], [place], [address] Phone: [phone]   Fax:  [phone]  Physical Therapy Treatment  Patient Details  Name: Winfred Redel MRN: [id number] Date of Birth: 09/06/1971 Referring Provider: Vira Browns MD  Encounter Date: 06/08/2017      PT End of Session - 06/08/17 1321    Visit Number 9   Number of Visits 17   Date for PT Re-Evaluation 06/08/17   PT Start Time 1150   PT Stop Time 1244   PT Time Calculation (min) 54 min   Activity Tolerance Patient tolerated treatment well   Behavior During Therapy [facility] for tasks assessed/performed      Past Medical History:  Diagnosis Date  . Anxiety   . Back pain, chronic   . Depression   . Hormone disorder   . Thyroid disease     Past Surgical History:  Procedure Laterality Date  . BACK SURGERY  2011, 2013, 2015   x3  . HEMORRHOID SURGERY  2017  . PELVIC LAPAROSCOPY     ovarian cyst removed  . TUBAL LIGATION      There were no vitals filed for this visit.      Subjective Assessment - 06/08/17 1151    Subjective Neck pain into right arm 8/10,.. started with falling on steps. No falling since she has started PT. She is going to get a study of her neck and see's MD on the 26th.   Feels like she understands how to use good posture.     Patient is accompained by: Interpreter   Pain Location Back   Pain Orientation Mid;Upper   Pain Descriptors / Indicators Pressure   Pain Type Chronic pain   Pain Radiating Towards not today   Pain Frequency Constant   Aggravating Factors  work at home    Pain Relieving Factors exercises,   Multiple Pain Sites --  No arm pain right now,  neck,  keeps her awake both sides and down to the middle of her back.                         [facility] Adult PT Treatment/Exercise - 06/08/17 0001      Therapeutic Activites    ADL's education,  sleeping, sitting posture   Other Therapeutic  Activities simulated lifting,  practiced squats,  golfer's lift, carry,  laundry and sweeping the floor     Shoulder Exercises: Supine   Horizontal ABduction 10 reps   Theraband Level (Shoulder Horizontal ABduction) Level 1 (Yellow)   External Rotation 10 reps   Theraband Level (Shoulder External Rotation) Level 1 (Yellow)   External Rotation Limitations cued for elbow position   Flexion 10 reps   Theraband Level (Shoulder Flexion) Level 1 (Yellow)   Flexion Limitations cued initially,   no pain   Other Supine Exercises diagonals " sash" each side,  cued to keep wrist straight,  she was able to do correctly on left, difficult on the right     Moist Heat Therapy   Number Minutes Moist Heat 10 Minutes   Moist Heat Location Lumbar Spine;Cervical                PT Education - 06/08/17 1321    Education provided Yes   Education Details ADL,  posture   Person(s) Educated Patient   Methods Explanation;Demonstration;Tactile cues;Verbal cues;Handout   Comprehension Verbalized understanding;Returned demonstration  PT Short Term Goals - 06/08/17 1216      PT SHORT TERM GOAL #2   Title pt to demo proper posture and lifting mechanics to prevent and reduce low back pain     PT SHORT TERM GOAL #3   Title pt to reduce muscle spasm int he low back / hip to reduce pain to </= 6/10 and promote trunk and hip mobility    Time 4   Period Weeks   Status Achieved           PT Long Term Goals - 06/08/17 1317      PT LONG TERM GOAL #1   Title pt to increase trunk flexion to >/= 40 degrees and extension/  bil side bending to >/= 18 degrees with </= 4/10 to promote functional trunk mobility    Time 8   Period Weeks   Status Unable to assess     PT LONG TERM GOAL #2   Title improve bil LE strength to >/= 4/5 in to provide hip/ knee stability with walking / standing for safety    Time 8   Period Weeks   Status Unable to assess     PT LONG TERM GOAL #3   Time 8   Period  Weeks   Status Unable to assess     PT LONG TERM GOAL #4   Title increase FOTO to </=49% limited to demo improvement in function    Time 8   Period Weeks   Status Unable to assess     PT LONG TERM GOAL #5   Title pt to be I with all HEP given as of last visit    Baseline needs cued with HEP   Time 8   Period Weeks   Status On-going               Plan - 06/08/17 1322    Clinical Impression Statement Patient has been sick from flu shot 2 weeks.  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  she did work on lower back exercises.  he lower back has been feeling good 4 days.  Her neck symptoms improved with posture education and practice and UE work with bands.   Her husband lost his job from the [employer] and extra stress increases her pain.  less pain noted at end of the session.   PT Next Visit Plan ERO   PT Home Exercise Plan lower trunk rotation, posterior pelvic tilt, SKTC, supine marching and heel slides with bracing Quad stretch left,  shoulder bands supine:  ER,  Narrow grip flexion,  Horizontal pulls.    Consulted and Agree with Plan of Care Patient      Patient will benefit from skilled therapeutic intervention in order to improve the following deficits and impairments:     Visit Diagnosis: Chronic bilateral low back pain with right-sided sciatica  Muscle weakness (generalized)  Muscle spasm of back  Other abnormalities of gait and mobility     Problem List Patient Active Problem List   Diagnosis Date Noted  . Scoliosis 05/26/2017    HARRIS,KAREN PTA 06/08/2017, 1:28 PM  [facility] [address] [place], [place], [address] Phone: [phone]   Fax:  [phone]  Name: Levern Kalka MRN: [id number] Date of Birth: 14-Nov-1970

## 2017-06-10 ENCOUNTER — Encounter: Payer: Self-pay | Admitting: Physical Therapy

## 2017-06-10 ENCOUNTER — Ambulatory Visit: Payer: Medicare HMO | Admitting: Physical Therapy

## 2017-06-10 DIAGNOSIS — G8929 Other chronic pain: Secondary | ICD-10-CM

## 2017-06-10 DIAGNOSIS — M5441 Lumbago with sciatica, right side: Secondary | ICD-10-CM | POA: Diagnosis not present

## 2017-06-10 DIAGNOSIS — M6281 Muscle weakness (generalized): Secondary | ICD-10-CM

## 2017-06-10 DIAGNOSIS — R2689 Other abnormalities of gait and mobility: Secondary | ICD-10-CM

## 2017-06-10 DIAGNOSIS — M62830 Muscle spasm of back: Secondary | ICD-10-CM

## 2017-06-10 NOTE — Therapy (Signed)
[place], [place], [address] Phone: [phone]   Fax:  [phone]  Physical Therapy Treatment / Re-certification  Patient Details  Name: Carlissa Pesola MRN: [id number] Date of Birth: [birth year] Referring Provider: Basil Dess MD  Encounter Date: 06/10/2017      PT End of Session - 06/10/17 1156    Visit Number 10   Number of Visits 17   Date for PT Re-Evaluation 07/08/17   Authorization Type MCR: KX mod by 15th visit, Progress not by 20th    PT Start Time 1146   PT Stop Time 1238   PT Time Calculation (min) 52 min   Activity Tolerance Patient tolerated treatment well   Behavior During Therapy [facility] for tasks assessed/performed      Past Medical History:  Diagnosis Date  . Anxiety   . Back pain, chronic   . Depression   . Hormone disorder   . Thyroid disease     Past Surgical History:  Procedure Laterality Date  . BACK SURGERY  2011, 2013, 2015   x3  . HEMORRHOID SURGERY  2017  . PELVIC LAPAROSCOPY     ovarian cyst removed  . TUBAL LIGATION      There were no vitals filed for this visit.      Subjective Assessment - 06/10/17 1151    Subjective "I am doing better today but I still have pain in the hips and some soreness in the upper back and low back"    Currently in Pain? Yes   Pain Score 4    Pain Orientation Mid   Pain Descriptors / Indicators Aching   Pain Type Chronic pain   Pain Onset More than a month ago   Pain Frequency Intermittent            [facility] PT Assessment - 06/10/17 1157      Observation/Other Assessments   Focus on Therapeutic Outcomes (FOTO)  56% limited     AROM   Lumbar Flexion 20   Lumbar Extension 5   Lumbar - Right Side Bend 10   Lumbar - Left Side Bend 10     Strength   Right Hip Flexion 3+/5   Right Hip Extension 3/5   Right Hip ABduction 3-/5   Right Hip ADduction 3+/5   Left Hip Flexion 4-/5   Left Hip Extension 4-/5   Left  Hip ABduction 3+/5   Left Hip ADduction 4-/5   Right Knee Flexion 3/5   Right Knee Extension 4-/5   Left Knee Flexion 4/5   Left Knee Extension 4/5                     [facility] Adult PT Treatment/Exercise - 06/10/17 1201      Lumbar Exercises: Stretches   Lower Trunk Rotation --  2 x 10     Lumbar Exercises: Seated   Other Seated Lumbar Exercises pelvic tilt 2 x 10 sitting on dyna disc, 2 x 10 marching, horizontal lift/ chop 2 x 10 using yellow theraband   multiple verbal cues for ADIM     Lumbar Exercises: Supine   Ab Set 10 reps;5 seconds   Bent Knee Raise 10 reps     Electrical Stimulation   Electrical Stimulation Location low back   Electrical Stimulation Action IFC   Electrical Stimulation Parameters level 22 x 10 min 100% scan   Electrical Stimulation Goals Pain  PT Short Term Goals - 06/10/17 1240      PT SHORT TERM GOAL #1   Title pt to be I with intial HEP    Time 4   Period Weeks   Status Achieved     PT SHORT TERM GOAL #2   Title pt to demo proper posture and lifting mechanics to prevent and reduce low back pain   Time 4   Period Weeks   Status Partially Met     PT SHORT TERM GOAL #3   Title pt to reduce muscle spasm int he low back / hip to reduce pain to </= 6/10 and promote trunk and hip mobility    Time 4   Period Weeks   Status Partially Met     PT SHORT TERM GOAL #4   Title pt to increase trunk mobility by >/= 10 degrees in all planes to with </= 6/10 pain to promote trunk mobility   Time 4   Period Weeks   Status On-going           PT Long Term Goals - 06/10/17 1241      PT LONG TERM GOAL #1   Title pt to increase trunk flexion to >/= 40 degrees and extension/  bil side bending to >/= 18 degrees with </= 4/10 to promote functional trunk mobility    Time 8   Period Weeks   Status On-going     PT LONG TERM GOAL #2   Title improve bil LE strength to >/= 4/5 in to provide hip/ knee stability with  walking / standing for safety    Time 8   Period Weeks   Status On-going     PT LONG TERM GOAL #3   Title pt to be able to stand / walk for >/= 45 min and navigate >/= 15 steps reciprocally with </= 5/10 pain for functional endurance for in home/ community ambulation    Time 8   Period Weeks   Status On-going     PT LONG TERM GOAL #4   Title increase FOTO to </=49% limited to demo improvement in function    Period Weeks   Status On-going     PT LONG TERM GOAL #5   Title pt to be I with all HEP given as of last visit    Period Weeks   Status On-going               Plan - 06/10/17 1238    Clinical Impression Statement pt reports having the flu which caused her to increase soreness due to not being able to do exercises. She demonstrates regression of trunk mobility due to increased pain and tightness. focused on trunk strengthening and posture which she performed well and rpeorted decreased pain with abdominal draw in manuever. She would benefit from physical therapy to decrease low back pain and improve trunk mobility.    PT Frequency 2x / week   PT Duration 3 weeks   PT Treatment/Interventions ADLs/Self Care Home Management;Moist Heat;Therapeutic activities;Therapeutic exercise;Dry needling;Taping;Manual techniques;Cryotherapy;Electrical Stimulation;Iontophoresis '4mg'$ /ml Dexamethasone;Ultrasound;Passive range of motion;Neuromuscular re-education;Balance training;Gait training;Functional mobility training;Patient/family education;Traction   PT Next Visit Plan trunk mobility, strengthening with core, manual techniques, modalities PRN for pain.    PT Home Exercise Plan lower trunk rotation, posterior pelvic tilt, SKTC, supine marching and heel slides with bracing Quad stretch left,  shoulder bands supine:  ER,  Narrow grip flexion,  Horizontal pulls.    Consulted and Agree with Plan of Care  Patient      Patient will benefit from skilled therapeutic intervention in order to improve  the following deficits and impairments:  Abnormal gait, Pain, Improper body mechanics, Postural dysfunction, Decreased endurance, Decreased activity tolerance, Decreased balance, Difficulty walking, Decreased strength, Decreased mobility, Increased fascial restricitons  Visit Diagnosis: Muscle weakness (generalized) - Plan: PT plan of care cert/re-cert  Chronic bilateral low back pain with right-sided sciatica - Plan: PT plan of care cert/re-cert  Muscle spasm of back - Plan: PT plan of care cert/re-cert  Other abnormalities of gait and mobility - Plan: PT plan of care cert/re-cert       G-Codes - 06-28-2017 1242    Functional Assessment Tool Used (Outpatient Only) ROM/ Strength/ pain / FOTO   Functional Limitation Changing and maintaining body position   Changing and Maintaining Body Position Current Status (O8358) At least 60 percent but less than 80 percent impaired, limited or restricted   Changing and Maintaining Body Position Goal Status (W4652) At least 40 percent but less than 60 percent impaired, limited or restricted      Problem List Patient Active Problem List   Diagnosis Date Noted  . Scoliosis 05/26/2017   Starr Lake PT, DPT, LAT, ATC  2017/06/28  12:44 PM      [facility] [address] [place], [place], [address] Phone: [phone]   Fax:  [phone]  Name: Adiel Mcnamara MRN: [id number] Date of Birth: Dec 10, 1970

## 2017-06-15 ENCOUNTER — Ambulatory Visit: Payer: Medicare HMO | Admitting: Physical Therapy

## 2017-06-15 ENCOUNTER — Encounter: Payer: Self-pay | Admitting: Physical Therapy

## 2017-06-15 DIAGNOSIS — G8929 Other chronic pain: Secondary | ICD-10-CM

## 2017-06-15 DIAGNOSIS — M5441 Lumbago with sciatica, right side: Secondary | ICD-10-CM

## 2017-06-15 DIAGNOSIS — R2689 Other abnormalities of gait and mobility: Secondary | ICD-10-CM

## 2017-06-15 DIAGNOSIS — M62830 Muscle spasm of back: Secondary | ICD-10-CM

## 2017-06-15 DIAGNOSIS — M6281 Muscle weakness (generalized): Secondary | ICD-10-CM

## 2017-06-15 NOTE — Therapy (Signed)
[place], [place], [address] Phone: [phone]   Fax:  [phone]  Physical Therapy Treatment  Patient Details  Name: Floye Fesler MRN: [id number] Date of Birth: Sep 18, 1970 Referring Provider: Basil Dess MD  Encounter Date: 06/15/2017      PT End of Session - 06/15/17 1235    Visit Number 11   Number of Visits 17   Date for PT Re-Evaluation 07/08/17   PT Start Time 1147   PT Stop Time 1244   PT Time Calculation (min) 57 min   Activity Tolerance Patient tolerated treatment well   Behavior During Therapy [facility] for tasks assessed/performed      Past Medical History:  Diagnosis Date  . Anxiety   . Back pain, chronic   . Depression   . Hormone disorder   . Thyroid disease     Past Surgical History:  Procedure Laterality Date  . BACK SURGERY  2011, 2013, 2015   x3  . HEMORRHOID SURGERY  2017  . PELVIC LAPAROSCOPY     ovarian cyst removed  . TUBAL LIGATION      There were no vitals filed for this visit.      Subjective Assessment - 06/15/17 1152    Subjective I am having a little pain in low back.     Patient is accompained by: Interpreter   Currently in Pain? Yes   Pain Score 3    Pain Location Back   Pain Orientation Mid;Right   Pain Descriptors / Indicators Throbbing   Pain Type Chronic pain   Pain Frequency Intermittent   Aggravating Factors  work at home   Pain Relieving Factors heat ,  exercises.   Multiple Pain Sites No                         [facility] Adult PT Treatment/Exercise - 06/15/17 0001      Lumbar Exercises: Stretches   Passive Hamstring Stretch 3 reps;30 seconds   Double Knee to Chest Stretch Limitations 10 x legs on ball   Lower Trunk Rotation --  10 x legs on ball.  noticed stretching pull on left,     Lumbar Exercises: Supine   Bridge 10 reps   Bridge Limitations legs on ball   Large Ball Abdominal Isometric 10 reps   Large Ball  Oblique Isometric 10 reps  each,  cued for technique initially,  breathing cue     Shoulder Exercises: Supine   Other Supine Exercises supine scapular stab,  harder right carpal tunnel.  ER, diagonalsm narrow grip and ER , both  yellow band     Moist Heat Therapy   Number Minutes Moist Heat 15 Minutes   Moist Heat Location Lumbar Spine     Electrical Stimulation   Electrical Stimulation Location low back to mid back   Electrical Stimulation Action IFC   Electrical Stimulation Parameters to tolerance   Electrical Stimulation Goals Pain                  PT Short Term Goals - 06/10/17 1240      PT SHORT TERM GOAL #1   Title pt to be I with intial HEP    Time 4   Period Weeks   Status Achieved     PT SHORT TERM GOAL #2   Title pt to demo proper posture and lifting mechanics to prevent and reduce low back pain  Time 4   Period Weeks   Status Partially Met     PT SHORT TERM GOAL #3   Title pt to reduce muscle spasm int he low back / hip to reduce pain to </= 6/10 and promote trunk and hip mobility    Time 4   Period Weeks   Status Partially Met     PT SHORT TERM GOAL #4   Title pt to increase trunk mobility by >/= 10 degrees in all planes to with </= 6/10 pain to promote trunk mobility   Time 4   Period Weeks   Status On-going           PT Long Term Goals - 06/10/17 1241      PT LONG TERM GOAL #1   Title pt to increase trunk flexion to >/= 40 degrees and extension/  bil side bending to >/= 18 degrees with </= 4/10 to promote functional trunk mobility    Time 8   Period Weeks   Status On-going     PT LONG TERM GOAL #2   Title improve bil LE strength to >/= 4/5 in to provide hip/ knee stability with walking / standing for safety    Time 8   Period Weeks   Status On-going     PT LONG TERM GOAL #3   Title pt to be able to stand / walk for >/= 45 min and navigate >/= 15 steps reciprocally with </= 5/10 pain for functional endurance for in home/  community ambulation    Time 8   Period Weeks   Status On-going     PT LONG TERM GOAL #4   Title increase FOTO to </=49% limited to demo improvement in function    Period Weeks   Status On-going     PT LONG TERM GOAL #5   Title pt to be I with all HEP given as of last visit    Period Weeks   Status On-going               Plan - 06/15/17 1236    Clinical Impression Statement 4/10 pain today.  She was able to do all exercises without increased pain.   PT Treatment/Interventions ADLs/Self Care Home Management;Moist Heat;Therapeutic activities;Therapeutic exercise;Dry needling;Taping;Manual techniques;Cryotherapy;Electrical Stimulation;Iontophoresis '4mg'$ /ml Dexamethasone;Ultrasound;Passive range of motion;Neuromuscular re-education;Balance training;Gait training;Functional mobility training;Patient/family education;Traction   PT Next Visit Plan trunk mobility, strengthening with core, manual techniques, modalities PRN for pain.    PT Home Exercise Plan lower trunk rotation, posterior pelvic tilt, SKTC, supine marching and heel slides with bracing Quad stretch left,  shoulder bands supine:  ER,  Narrow grip flexion,  Horizontal pulls.    Consulted and Agree with Plan of Care Patient      Patient will benefit from skilled therapeutic intervention in order to improve the following deficits and impairments:  Abnormal gait, Pain, Improper body mechanics, Postural dysfunction, Decreased endurance, Decreased activity tolerance, Decreased balance, Difficulty walking, Decreased strength, Decreased mobility, Increased fascial restricitons  Visit Diagnosis: Muscle weakness (generalized)  Chronic bilateral low back pain with right-sided sciatica  Muscle spasm of back  Other abnormalities of gait and mobility     Problem List Patient Active Problem List   Diagnosis Date Noted  . Scoliosis 05/26/2017    Atiyana Welte PTA 06/15/2017, 12:38 PM  [facility] [address] [place], [place], [address] Phone: [phone]   Fax:  [phone]  Name: Remy Dia MRN: [id number] Date of Birth:  12/12/1970   

## 2017-06-17 ENCOUNTER — Ambulatory Visit: Payer: Medicare HMO | Admitting: Physical Therapy

## 2017-06-18 ENCOUNTER — Ambulatory Visit (INDEPENDENT_AMBULATORY_CARE_PROVIDER_SITE_OTHER): Payer: Medicare HMO | Admitting: Physical Medicine and Rehabilitation

## 2017-06-18 ENCOUNTER — Encounter (INDEPENDENT_AMBULATORY_CARE_PROVIDER_SITE_OTHER): Payer: Self-pay | Admitting: Physical Medicine and Rehabilitation

## 2017-06-18 DIAGNOSIS — R202 Paresthesia of skin: Secondary | ICD-10-CM

## 2017-06-18 NOTE — Progress Notes (Deleted)
Numbness and pain right wrist with some in the fingers. Stretching sensation in right arm. Difficulty picking up things, combing hair- these activities cause pain. Right hand dominant.

## 2017-06-21 ENCOUNTER — Encounter: Payer: Self-pay | Admitting: Physical Therapy

## 2017-06-21 ENCOUNTER — Ambulatory Visit: Payer: Medicare HMO | Admitting: Physical Therapy

## 2017-06-21 DIAGNOSIS — G8929 Other chronic pain: Secondary | ICD-10-CM

## 2017-06-21 DIAGNOSIS — M5441 Lumbago with sciatica, right side: Secondary | ICD-10-CM | POA: Diagnosis not present

## 2017-06-21 DIAGNOSIS — M6281 Muscle weakness (generalized): Secondary | ICD-10-CM

## 2017-06-21 DIAGNOSIS — M62830 Muscle spasm of back: Secondary | ICD-10-CM

## 2017-06-21 DIAGNOSIS — R2689 Other abnormalities of gait and mobility: Secondary | ICD-10-CM

## 2017-06-21 NOTE — Therapy (Addendum)
[place], [place], [address] Phone: [phone]   Fax:  [phone]  Physical Therapy Treatment / Discharge Summary  Patient Details  Name: Jamie Garner MRN: [id number] Date of Birth: [birth year] Referring Provider: Basil Dess MD  Encounter Date: 06/21/2017      PT End of Session - 06/21/17 1246    Visit Number 12   Number of Visits 17   Date for PT Re-Evaluation 07/08/17   Authorization Type MCR: KX mod by 15th visit, Progress not by 20th    PT Start Time 1145   PT Stop Time 1235   PT Time Calculation (min) 50 min   Activity Tolerance Patient tolerated treatment well   Behavior During Therapy [facility] for tasks assessed/performed      Past Medical History:  Diagnosis Date  . Anxiety   . Back pain, chronic   . Depression   . Hormone disorder   . Thyroid disease     Past Surgical History:  Procedure Laterality Date  . BACK SURGERY  2011, 2013, 2015   x3  . HEMORRHOID SURGERY  2017  . PELVIC LAPAROSCOPY     ovarian cyst removed  . TUBAL LIGATION      There were no vitals filed for this visit.      Subjective Assessment - 06/21/17 1150    Subjective "I am doing better today, and the neck is better"   Currently in Pain? No/denies   Pain Score 0-No pain   Pain Location Back   Pain Type Chronic pain   Pain Onset More than a month ago   Aggravating Factors  N/A   Pain Relieving Factors heat, exercise                          [facility] Adult PT Treatment/Exercise - 06/21/17 1153      Lumbar Exercises: Stretches   Passive Hamstring Stretch 4 reps;30 seconds  2 x bil     Lumbar Exercises: Aerobic   Stationary Bike Nustep L5 Ue/LE x 63mnutes     Lumbar Exercises: Seated   Other Seated Lumbar Exercises seated on red physioball pelvic tilts 2 x 10 holding 5 sec, alternating marching 2 x 10, alternating marching with contralater UE movement     Lumbar Exercises:  Supine   Ab Set 10 reps;5 seconds   Bridge 10 reps   Bridge Limitations legs on ball   Other Supine Lumbar Exercises dead bug 5 x 15 sec, 2 x 10 with alternating UE/LE     Moist Heat Therapy   Number Minutes Moist Heat 10 Minutes   Moist Heat Location Lumbar Spine  supine                  PT Short Term Goals - 06/10/17 1240      PT SHORT TERM GOAL #1   Title pt to be I with intial HEP    Time 4   Period Weeks   Status Achieved     PT SHORT TERM GOAL #2   Title pt to demo proper posture and lifting mechanics to prevent and reduce low back pain   Time 4   Period Weeks   Status Partially Met     PT SHORT TERM GOAL #3   Title pt to reduce muscle spasm int he low back / hip to reduce pain to </= 6/10 and promote trunk and hip mobility  Time 4   Period Weeks   Status Partially Met     PT SHORT TERM GOAL #4   Title pt to increase trunk mobility by >/= 10 degrees in all planes to with </= 6/10 pain to promote trunk mobility   Time 4   Period Weeks   Status On-going           PT Long Term Goals - 06/10/17 1241      PT LONG TERM GOAL #1   Title pt to increase trunk flexion to >/= 40 degrees and extension/  bil side bending to >/= 18 degrees with </= 4/10 to promote functional trunk mobility    Time 8   Period Weeks   Status On-going     PT LONG TERM GOAL #2   Title improve bil LE strength to >/= 4/5 in to provide hip/ knee stability with walking / standing for safety    Time 8   Period Weeks   Status On-going     PT LONG TERM GOAL #3   Title pt to be able to stand / walk for >/= 45 min and navigate >/= 15 steps reciprocally with </= 5/10 pain for functional endurance for in home/ community ambulation    Time 8   Period Weeks   Status On-going     PT LONG TERM GOAL #4   Title increase FOTO to </=49% limited to demo improvement in function    Period Weeks   Status On-going     PT LONG TERM GOAL #5   Title pt to be I with all HEP given as of last  visit    Period Weeks   Status On-going               Plan - 06/21/17 1246    Clinical Impression Statement pt reported no pain today and stated she hasn't had back or neck pain for over 2 days. Focused on endurance training and core strengthening which she performed well. MHP post session for muscle soreness, discussed trialing not using e-stim which pt agreed.    PT Next Visit Plan core strengthening, functional lifting/ carrying activities strengthening. manual techniques, modalities PRN for pain. endurance training   PT Home Exercise Plan lower trunk rotation, posterior pelvic tilt, SKTC, supine marching and heel slides with bracing Quad stretch left,  shoulder bands supine:  ER,  Narrow grip flexion,  Horizontal pulls.    Consulted and Agree with Plan of Care Patient      Patient will benefit from skilled therapeutic intervention in order to improve the following deficits and impairments:  Abnormal gait, Pain, Improper body mechanics, Postural dysfunction, Decreased endurance, Decreased activity tolerance, Decreased balance, Difficulty walking, Decreased strength, Decreased mobility, Increased fascial restricitons  Visit Diagnosis: Muscle weakness (generalized)  Chronic bilateral low back pain with right-sided sciatica  Muscle spasm of back  Other abnormalities of gait and mobility     Problem List Patient Active Problem List   Diagnosis Date Noted  . Scoliosis 05/26/2017   Starr Lake PT, DPT, LAT, ATC  06/21/17  12:53 PM      [facility] [address] [place], [place], [address] Phone: [phone]   Fax:  [phone]  Name: Jamie Garner MRN: [id number] Date of Birth: October 12, 1970       PHYSICAL THERAPY DISCHARGE SUMMARY  Visits from Start of Care: 12  Current functional level related to goals / functional outcomes: See goals   Remaining  deficits: Unknown due to pt not returning.     Education / Equipment: HEP  Plan: Patient agrees to discharge.  Patient goals were partially met. Patient is being discharged due to not returning since the last visit.  ?????       Kristoffer Leamon PT, DPT, LAT, ATC  06/30/17  11:48 AM

## 2017-06-21 NOTE — Procedures (Signed)
EMG & NCV Findings: All nerve conduction studies (as indicated in the following tables) were within normal limits.    All examined muscles (as indicated in the following table) showed no evidence of electrical instability.    Impression: Essentially NORMAL electrodiagnostic study of the right upper limb.  There is no significant electrodiagnostic evidence of nerve entrapment, brachial plexopathy or.    *As you know, purely sensory or demyelinating radiculopathies and chemical radiculitis may not be detected with this particular electrodiagnostic study.  This electrodiagnostic study cannot rule out small fiber polyneuropathy and dysesthesias from central pain sensitization syndromes such as fibromyalgia.  Recommendations: 1.  Follow-up with referring physician. 2.  Continue current management of symptoms.   Nerve Conduction Studies Anti Sensory Summary Table   Stim Site NR Peak (ms) Norm Peak (ms) P-T Amp (V) Norm P-T Amp Site1 Site2 Delta-P (ms) Dist (cm) Vel (m/s) Norm Vel (m/s)  Right Median Acr Palm Anti Sensory (2nd Digit)  33C  Wrist    3.1 <3.6 26.5 >10 Wrist Palm 1.3 0.0    Palm    1.8 <2.0 25.5         Right Radial Anti Sensory (Base 1st Digit)  34.2C  Wrist    2.2 <3.1 15.7  Wrist Base 1st Digit 2.2 0.0    Right Ulnar Anti Sensory (5th Digit)  33.4C  Wrist    3.0 <3.7 22.7 >15.0 Wrist 5th Digit 3.0 14.0 47 >38   Motor Summary Table   Stim Site NR Onset (ms) Norm Onset (ms) O-P Amp (mV) Norm O-P Amp Site1 Site2 Delta-0 (ms) Dist (cm) Vel (m/s) Norm Vel (m/s)  Right Median Motor (Abd Poll Brev)  33.3C  Wrist    3.2 <4.2 6.4 >5 Elbow Wrist 3.8 20.3 53 >50  Elbow    7.0  6.0         Right Ulnar Motor (Abd Dig Min)  32.9C  Wrist    2.8 <4.2 7.6 >3 B Elbow Wrist 3.3 20.0 61 >53  B Elbow    6.1  6.6  A Elbow B Elbow 0.9 8.0 89 >53  A Elbow    7.0  6.5          EMG   Side Muscle Nerve Root Ins Act Fibs Psw Amp Dur Poly Recrt Int Dennie Bible Comment  Right 1stDorInt Ulnar  C8-T1 Nml Nml Nml Nml Nml 0 Nml Nml   Right Abd Poll Brev Median C8-T1 Nml Nml Nml Nml Nml 0 Nml Nml   Right ExtDigCom   Nml Nml Nml Nml Nml 0 Nml Nml   Right Triceps Radial C6-7-8 Nml Nml Nml Nml Nml 0 Nml Nml   Right Deltoid Axillary C5-6 Nml Nml Nml Nml Nml 0 Nml Nml     Nerve Conduction Studies Anti Sensory Left/Right Comparison   Stim Site L Lat (ms) R Lat (ms) L-R Lat (ms) L Amp (V) R Amp (V) L-R Amp (%) Site1 Site2 L Vel (m/s) R Vel (m/s) L-R Vel (m/s)  Median Acr Palm Anti Sensory (2nd Digit)  33C  Wrist  3.1   26.5  Wrist Palm     Palm  1.8   25.5        Radial Anti Sensory (Base 1st Digit)  34.2C  Wrist  2.2   15.7  Wrist Base 1st Digit     Ulnar Anti Sensory (5th Digit)  33.4C  Wrist  3.0   22.7  Wrist 5th Digit  47    Motor  Left/Right Comparison   Stim Site L Lat (ms) R Lat (ms) L-R Lat (ms) L Amp (mV) R Amp (mV) L-R Amp (%) Site1 Site2 L Vel (m/s) R Vel (m/s) L-R Vel (m/s)  Median Motor (Abd Poll Brev)  33.3C  Wrist  3.2   6.4  Elbow Wrist  53   Elbow  7.0   6.0        Ulnar Motor (Abd Dig Min)  32.9C  Wrist  2.8   7.6  B Elbow Wrist  61   B Elbow  6.1   6.6  A Elbow B Elbow  89   A Elbow  7.0   6.5           Waveforms:

## 2017-06-21 NOTE — Progress Notes (Signed)
HENRINE HAYTER [place] - 46 y.o. female MRN [id number]  Date of birth: 1971-08-18  Office Visit Note: Visit Date: 06/18/2017 PCP: Henri Medal, MD Referred by: Henri Medal, MD  Subjective: Chief Complaint  Patient presents with  . Right Hand - Pain, Numbness   HPI: Mrs. Jamie Garner is a pleasant 46 year old right-hand dominant female complaining of several weeks to months history of numbness and pain in the right wrist and some of the fingers. She is here with an interpreter who provides language interpretation. She cannot tell me any specific distributions of the numbness or tingling in the hand to be the whole hand at times. She feels a stretching sensation in the right arm as well. She reports difficulty with picking up objects and dropping objects. She reports that combing her hair and using her hand causes pain. She denies any left-sided complaints. She does have some neck pain and occipital pain referring to the head. She's had prior lumbar fusion. She has not had prior electrodiagnostic study that we have for review. She does report having had a prior electrodiagnostic study.    ROS Otherwise per HPI.  Assessment & Plan: Visit Diagnoses:  1. Paresthesia of skin     Plan: No additional findings.  Impression: Essentially NORMAL electrodiagnostic study of the right upper limb.  There is no significant electrodiagnostic evidence of nerve entrapment, brachial plexopathy or.    *As you know, purely sensory or demyelinating radiculopathies and chemical radiculitis may not be detected with this particular electrodiagnostic study.  This electrodiagnostic study cannot rule out small fiber polyneuropathy and dysesthesias from central pain sensitization syndromes such as fibromyalgia.  Recommendations: 1.  Follow-up with referring physician. 2.  Continue current management of symptoms.   Meds & Orders: No orders of the defined types were placed in this encounter.   Orders  Placed This Encounter  Procedures  . NCV with EMG (electromyography)    Follow-up: No Follow-up on file.   Procedures: No procedures performed  EMG & NCV Findings: All nerve conduction studies (as indicated in the following tables) were within normal limits.    All examined muscles (as indicated in the following table) showed no evidence of electrical instability.    Impression: Essentially NORMAL electrodiagnostic study of the right upper limb.  There is no significant electrodiagnostic evidence of nerve entrapment, brachial plexopathy or.    *As you know, purely sensory or demyelinating radiculopathies and chemical radiculitis may not be detected with this particular electrodiagnostic study.  This electrodiagnostic study cannot rule out small fiber polyneuropathy and dysesthesias from central pain sensitization syndromes such as fibromyalgia.  Recommendations: 1.  Follow-up with referring physician. 2.  Continue current management of symptoms.   Nerve Conduction Studies Anti Sensory Summary Table   Stim Site NR Peak (ms) Norm Peak (ms) P-T Amp (V) Norm P-T Amp Site1 Site2 Delta-P (ms) Dist (cm) Vel (m/s) Norm Vel (m/s)  Right Median Acr Palm Anti Sensory (2nd Digit)  33C  Wrist    3.1 <3.6 26.5 >10 Wrist Palm 1.3 0.0    Palm    1.8 <2.0 25.5         Right Radial Anti Sensory (Base 1st Digit)  34.2C  Wrist    2.2 <3.1 15.7  Wrist Base 1st Digit 2.2 0.0    Right Ulnar Anti Sensory (5th Digit)  33.4C  Wrist    3.0 <3.7 22.7 >15.0 Wrist 5th Digit 3.0 14.0 47 >38   Motor Summary Table  Stim Site NR Onset (ms) Norm Onset (ms) O-P Amp (mV) Norm O-P Amp Site1 Site2 Delta-0 (ms) Dist (cm) Vel (m/s) Norm Vel (m/s)  Right Median Motor (Abd Poll Brev)  33.3C  Wrist    3.2 <4.2 6.4 >5 Elbow Wrist 3.8 20.3 53 >50  Elbow    7.0  6.0         Right Ulnar Motor (Abd Dig Min)  32.9C  Wrist    2.8 <4.2 7.6 >3 B Elbow Wrist 3.3 20.0 61 >53  B Elbow    6.1  6.6  A Elbow B Elbow 0.9  8.0 89 >53  A Elbow    7.0  6.5          EMG   Side Muscle Nerve Root Ins Act Fibs Psw Amp Dur Poly Recrt Int Dennie Bible Comment  Right 1stDorInt Ulnar C8-T1 Nml Nml Nml Nml Nml 0 Nml Nml   Right Abd Poll Brev Median C8-T1 Nml Nml Nml Nml Nml 0 Nml Nml   Right ExtDigCom   Nml Nml Nml Nml Nml 0 Nml Nml   Right Triceps Radial C6-7-8 Nml Nml Nml Nml Nml 0 Nml Nml   Right Deltoid Axillary C5-6 Nml Nml Nml Nml Nml 0 Nml Nml     Nerve Conduction Studies Anti Sensory Left/Right Comparison   Stim Site L Lat (ms) R Lat (ms) L-R Lat (ms) L Amp (V) R Amp (V) L-R Amp (%) Site1 Site2 L Vel (m/s) R Vel (m/s) L-R Vel (m/s)  Median Acr Palm Anti Sensory (2nd Digit)  33C  Wrist  3.1   26.5  Wrist Palm     Palm  1.8   25.5        Radial Anti Sensory (Base 1st Digit)  34.2C  Wrist  2.2   15.7  Wrist Base 1st Digit     Ulnar Anti Sensory (5th Digit)  33.4C  Wrist  3.0   22.7  Wrist 5th Digit  47    Motor Left/Right Comparison   Stim Site L Lat (ms) R Lat (ms) L-R Lat (ms) L Amp (mV) R Amp (mV) L-R Amp (%) Site1 Site2 L Vel (m/s) R Vel (m/s) L-R Vel (m/s)  Median Motor (Abd Poll Brev)  33.3C  Wrist  3.2   6.4  Elbow Wrist  53   Elbow  7.0   6.0        Ulnar Motor (Abd Dig Min)  32.9C  Wrist  2.8   7.6  B Elbow Wrist  61   B Elbow  6.1   6.6  A Elbow B Elbow  89   A Elbow  7.0   6.5           Waveforms:            Clinical History: No specialty comments available.  She reports that she has never smoked. She has never used smokeless tobacco. No results for input(s): HGBA1C, LABURIC in the last 8760 hours.  Objective:  VS:  HT:    WT:   BMI:     BP:   HR: bpm  TEMP: ( )  RESP:  Physical Exam  Musculoskeletal:  Inspection reveals no atrophy of the bilateral APB or FDI or hand intrinsics. There is no swelling, color changes, allodynia or dystrophic changes. There is 5 out of 5 strength in the bilateral wrist extension, finger abduction and long finger flexion. There is intact sensation  to light touch in all dermatomal and peripheral  nerve distributions. There is a negative Phalen's test bilaterally. There is a negative Hoffmann's test bilaterally.  Neurological: She is alert. She exhibits normal muscle tone. Coordination normal.    Ortho Exam Imaging: No results found.  Past Medical/Family/Surgical/Social History: Medications & Allergies reviewed per EMR Patient Active Problem List   Diagnosis Date Noted  . Scoliosis 05/26/2017   Past Medical History:  Diagnosis Date  . Anxiety   . Back pain, chronic   . Depression   . Hormone disorder   . Thyroid disease    Family History  Problem Relation Age of Onset  . Breast cancer Maternal Grandmother   . Diabetes Maternal Grandfather    Past Surgical History:  Procedure Laterality Date  . BACK SURGERY  2011, 2013, 2015   x3  . HEMORRHOID SURGERY  2017  . PELVIC LAPAROSCOPY     ovarian cyst removed  . TUBAL LIGATION     Social History   Occupational History  . Not on file.   Social History Main Topics  . Smoking status: Never Smoker  . Smokeless tobacco: Never Used  . Alcohol use No  . Drug use: No  . Sexual activity: Yes    Partners: Male

## 2017-06-22 ENCOUNTER — Encounter (INDEPENDENT_AMBULATORY_CARE_PROVIDER_SITE_OTHER): Payer: Self-pay | Admitting: Specialist

## 2017-06-22 ENCOUNTER — Ambulatory Visit (INDEPENDENT_AMBULATORY_CARE_PROVIDER_SITE_OTHER): Payer: Medicare HMO | Admitting: Specialist

## 2017-06-22 VITALS — BP 121/79 | HR 83 | Ht 63.0 in | Wt 168.0 lb

## 2017-06-22 DIAGNOSIS — M4326 Fusion of spine, lumbar region: Secondary | ICD-10-CM | POA: Diagnosis not present

## 2017-06-22 DIAGNOSIS — M47812 Spondylosis without myelopathy or radiculopathy, cervical region: Secondary | ICD-10-CM | POA: Diagnosis not present

## 2017-06-22 DIAGNOSIS — M4183 Other forms of scoliosis, cervicothoracic region: Secondary | ICD-10-CM | POA: Diagnosis not present

## 2017-06-22 NOTE — Patient Instructions (Signed)
Avoid frequent bending and stooping  No lifting greater than 5-10 lbs. May use ice or moist heat for pain. Weight loss is of benefit. Avoid overhead lifting and overhead use of the arms. Do not lift greater than 5-10 lbs.

## 2017-06-22 NOTE — Progress Notes (Signed)
Office Visit Note   Patient: Jamie Garner           Date of Birth: 1971-03-31           MRN: [id number] Visit Date: 06/22/2017              Requested by: Henri Medal, MD [address] [place], [place] [address] PCP: Jackalyn Lombard I, MD   Assessment & Plan: Visit Diagnoses:  1. Other forms of scoliosis, cervicothoracic region   2. Spondylosis of cervical region without myelopathy or radiculopathy   3. Fusion of spine of lumbar region     Plan: Avoid frequent bending and stooping  No lifting greater than 5-10 lbs. May use ice or moist heat for pain. Weight loss is of benefit. Avoid overhead lifting and overhead use of the arms. Do not lift greater than 5-10 lbs.  Follow-Up Instructions: No Follow-up on file.   Orders:  No orders of the defined types were placed in this encounter.  No orders of the defined types were placed in this encounter.     Procedures: No procedures performed   Clinical Data: No additional findings.   Subjective: Chief Complaint  Patient presents with  . Right Arm - Follow-up    46 year old female with cervicothoracic scoliosis, she was placed in PT and she has done well, PT plans to continue for an additional 2 weeks. Due toUE numbness an EMG/NCV was performed which returned normal. PT is helping with the neck and lumbar areas. No bowel or bladder difficulties. She is standing and walking well. After therapy she may wish to enlist in a  Program with the [facility] or [place].Marland Kitchen    Review of Systems  Constitutional: Negative.   HENT: Negative.   Eyes: Negative.   Respiratory: Negative.   Cardiovascular: Negative.   Gastrointestinal: Negative.   Endocrine: Negative.   Genitourinary: Negative.   Musculoskeletal: Negative.   Skin: Negative.   Allergic/Immunologic: Negative.   Neurological: Negative.   Hematological: Negative.   Psychiatric/Behavioral: Negative.      Objective: Vital Signs: BP 121/79 (BP Location:  Left Arm, Patient Position: Sitting)   Pulse 83   Ht  (1.6 m)   Wt 168 lb (76.2 kg)   BMI 29.76 kg/m   Physical Exam  Constitutional: She is oriented to person, place, and time. She appears well-developed and well-nourished.  HENT:  Head: Normocephalic and atraumatic.  Eyes: Pupils are equal, round, and reactive to light. EOM are normal.  Neck: Normal range of motion. Neck supple.  Pulmonary/Chest: Effort normal and breath sounds normal.  Abdominal: Soft. Bowel sounds are normal.  Neurological: She is alert and oriented to person, place, and time.  Skin: Skin is warm and dry.  Psychiatric: She has a normal mood and affect. Her behavior is normal. Judgment and thought content normal.    Back Exam   Tenderness  The patient is experiencing tenderness in the cervical and lumbar.  Range of Motion  Extension: normal  Flexion: abnormal  Lateral Bend Right: normal  Lateral Bend Left: normal  Rotation Right: normal  Rotation Left: normal   Muscle Strength  Right Quadriceps:  5/5  Left Quadriceps:  5/5  Right Hamstrings:  5/5  Left Hamstrings:  5/5   Tests  Straight leg raise right: negative Straight leg raise left: negative  Reflexes  Patellar: normal Achilles: normal Biceps: 1/4 Babinski's sign: normal   Other  Toe Walk: normal Heel Walk: normal Sensation:  normal Gait: normal  Erythema: no back redness Scars: absent  Comments:  Decreased ROM right lateral rotation and bending, motor in the UE is normal      Specialty Comments:  No specialty comments available.  Imaging: No results found.   PMFS History: Patient Active Problem List   Diagnosis Date Noted  . Scoliosis 05/26/2017   Past Medical History:  Diagnosis Date  . Anxiety   . Back pain, chronic   . Depression   . Hormone disorder   . Thyroid disease     Family History  Problem Relation Age of Onset  . Breast cancer Maternal Grandmother   . Diabetes Maternal Grandfather     Past  Surgical History:  Procedure Laterality Date  . BACK SURGERY  2011, 2013, 2015   x3  . HEMORRHOID SURGERY  2017  . PELVIC LAPAROSCOPY     ovarian cyst removed  . TUBAL LIGATION     Social History   Occupational History  . Not on file.   Social History Main Topics  . Smoking status: Never Smoker  . Smokeless tobacco: Never Used  . Alcohol use No  . Drug use: No  . Sexual activity: Yes    Partners: Male

## 2017-06-23 ENCOUNTER — Ambulatory Visit: Payer: Medicare HMO | Admitting: Physical Therapy

## 2017-06-30 ENCOUNTER — Ambulatory Visit: Payer: Medicare HMO | Admitting: Physical Therapy

## 2017-07-01 ENCOUNTER — Telehealth: Payer: Self-pay | Admitting: Physical Therapy

## 2017-07-01 NOTE — Telephone Encounter (Signed)
Interpreter LVM regarding missed appointment on 06/30/2017. Per clinic policy after 3 missed appointments its grounds for discharge and that today is her 5th missed visit. Per policy she will be discharged from PT today.

## 2017-07-02 ENCOUNTER — Ambulatory Visit: Payer: Medicare HMO | Admitting: Physical Therapy

## 2017-07-06 ENCOUNTER — Encounter: Payer: Medicare HMO | Admitting: Physical Therapy

## 2017-07-08 ENCOUNTER — Encounter: Payer: Medicare HMO | Admitting: Physical Therapy

## 2017-07-14 ENCOUNTER — Encounter (HOSPITAL_COMMUNITY): Payer: Self-pay | Admitting: *Deleted

## 2017-07-14 ENCOUNTER — Emergency Department (HOSPITAL_COMMUNITY)
Admission: EM | Admit: 2017-07-14 | Discharge: 2017-07-15 | Disposition: A | Discharge status: Home / Self Care | Payer: Medicare HMO | Attending: Emergency Medicine | Admitting: Emergency Medicine

## 2017-07-14 ENCOUNTER — Encounter (HOSPITAL_COMMUNITY): Payer: Self-pay | Admitting: Family Medicine

## 2017-07-14 ENCOUNTER — Ambulatory Visit (HOSPITAL_COMMUNITY)
Admission: EM | Admit: 2017-07-14 | Discharge: 2017-07-14 | Disposition: A | Discharge status: Home / Self Care | Payer: Medicare HMO | Source: Home / Self Care | Attending: Family Medicine | Admitting: Family Medicine

## 2017-07-14 DIAGNOSIS — N83202 Unspecified ovarian cyst, left side: Secondary | ICD-10-CM | POA: Diagnosis not present

## 2017-07-14 DIAGNOSIS — R10817 Generalized abdominal tenderness: Secondary | ICD-10-CM | POA: Diagnosis not present

## 2017-07-14 DIAGNOSIS — R110 Nausea: Secondary | ICD-10-CM | POA: Diagnosis not present

## 2017-07-14 DIAGNOSIS — R109 Unspecified abdominal pain: Secondary | ICD-10-CM | POA: Diagnosis present

## 2017-07-14 DIAGNOSIS — Z79899 Other long term (current) drug therapy: Secondary | ICD-10-CM | POA: Diagnosis not present

## 2017-07-14 DIAGNOSIS — R1012 Left upper quadrant pain: Secondary | ICD-10-CM | POA: Diagnosis not present

## 2017-07-14 DIAGNOSIS — N940 Mittelschmerz: Secondary | ICD-10-CM | POA: Diagnosis not present

## 2017-07-14 DIAGNOSIS — R197 Diarrhea, unspecified: Secondary | ICD-10-CM | POA: Diagnosis not present

## 2017-07-14 LAB — URINALYSIS, ROUTINE W REFLEX MICROSCOPIC
BILIRUBIN URINE: NEGATIVE
GLUCOSE, UA: NEGATIVE mg/dL
HGB URINE DIPSTICK: NEGATIVE
Ketones, ur: NEGATIVE mg/dL
Leukocytes, UA: NEGATIVE
Nitrite: NEGATIVE
PH: 7 (ref 5.0–8.0)
Protein, ur: NEGATIVE mg/dL
SPECIFIC GRAVITY, URINE: 1.012 (ref 1.005–1.030)

## 2017-07-14 LAB — COMPREHENSIVE METABOLIC PANEL
ALBUMIN: 4 g/dL (ref 3.5–5.0)
ALK PHOS: 50 U/L (ref 38–126)
ALT: 15 U/L (ref 14–54)
ANION GAP: 5 (ref 5–15)
AST: 18 U/L (ref 15–41)
BUN: 11 mg/dL (ref 6–20)
CALCIUM: 9 mg/dL (ref 8.9–10.3)
CO2: 19 mmol/L — AB (ref 22–32)
Chloride: 112 mmol/L — ABNORMAL HIGH (ref 101–111)
Creatinine, Ser: 0.79 mg/dL (ref 0.44–1.00)
GFR calc Af Amer: >60 mL/min (ref >60)
GFR calc non Af Amer: >60 mL/min (ref >60)
GLUCOSE: 94 mg/dL (ref 65–99)
Potassium: 4 mmol/L (ref 3.5–5.1)
SODIUM: 136 mmol/L (ref 135–145)
Total Bilirubin: 0.6 mg/dL (ref 0.3–1.2)
Total Protein: 6.8 g/dL (ref 6.5–8.1)

## 2017-07-14 LAB — CBC
HCT: 38.7 % (ref 36.0–46.0)
HEMOGLOBIN: 13.6 g/dL (ref 12.0–15.0)
MCH: 30.8 pg (ref 26.0–34.0)
MCHC: 35.1 g/dL (ref 30.0–36.0)
MCV: 87.8 fL (ref 78.0–100.0)
Platelets: 397 10*3/uL (ref 150–400)
RBC: 4.41 MIL/uL (ref 3.87–5.11)
RDW: 13.3 % (ref 11.5–15.5)
WBC: 7.9 10*3/uL (ref 4.0–10.5)

## 2017-07-14 LAB — LIPASE, BLOOD: Lipase: 39 U/L (ref 11–51)

## 2017-07-14 MED ORDER — OXYCODONE-ACETAMINOPHEN 5-325 MG PO TABS
1.0000 | ORAL_TABLET | ORAL | Status: DC | PRN
  Administered 2017-07-14: 1 via ORAL

## 2017-07-14 MED ORDER — ONDANSETRON 4 MG PO TBDP
4.0000 mg | ORAL_TABLET | Freq: Once | ORAL | Status: DC
  Administered 2017-07-14: 4 mg via ORAL

## 2017-07-14 MED ORDER — ONDANSETRON 4 MG PO TBDP
ORAL_TABLET | ORAL | Status: AC
  Filled 2017-07-14: qty 1

## 2017-07-14 MED ORDER — OXYCODONE-ACETAMINOPHEN 5-325 MG PO TABS
ORAL_TABLET | ORAL | Status: AC
  Filled 2017-07-14: qty 1

## 2017-07-14 NOTE — ED Triage Notes (Signed)
Pt here for pain ian upper abdomen x 2 weeks. Reports nausea and diarrhea. Denies fever, chills.

## 2017-07-14 NOTE — ED Triage Notes (Signed)
Used Nurse, learning disabilitytranslator. Pt is tearful, reports LUQ pain that has been severe x 1.5 weeks. Having nausea and diarrhea. Denies vomiting or urinary symptoms.

## 2017-07-14 NOTE — Discharge Instructions (Signed)
Patient is discharged to the emergency department for severe abdominal pain and tenderness. Patient appears to be in moderate distress. She is given Zofran 4 mg sublingual due to the nausea.

## 2017-07-14 NOTE — ED Notes (Signed)
Pt escorted to ED via wheelchair x1 RN and her husband.  Pt was in a lot pain during transport.  Pt had pursed lip breathing, and was unable to communicate much.  Pt was left in ED to be checked in.  Pt was A&O at the time I left her.

## 2017-07-14 NOTE — ED Provider Notes (Signed)
[facility]    CSN: [id number] Arrival date & time: 07/14/17  1536     History   Chief Complaint Chief Complaint  Patient presents with  . Abdominal Pain    HPI Jamie Garner H Jamie Garner Garner is a 46 y.o. female.   Video interpreter utilized: 46 year old female presents to the urgent care with severe left upper quadrant pain. Greatest pain is described as being beneath the left lower rib. It does radiate inferiorly. She describes it as a throbbing and burning pain is rated as a 10 out of 10. She is moaning, and groaning with pain and breathing rapidly. She has had associated nausea and diarrhea. Has not been eating. Low by mouth intake. The pain started about one and half weeks ago but has increased in the past 24-36 hours. Recently she was diagnosed with bronchitis and asthma and was treated with an antibiotic at a different facility.      Past Medical History:  Diagnosis Date  . Anxiety   . Back pain, chronic   . Depression   . Hormone disorder   . Thyroid disease     Patient Active Problem List   Diagnosis Date Noted  . Scoliosis 05/26/2017    Past Surgical History:  Procedure Laterality Date  . BACK SURGERY  2011, 2013, 2015   x3  . HEMORRHOID SURGERY  2017  . PELVIC LAPAROSCOPY     ovarian cyst removed  . TUBAL LIGATION      OB History    Gravida Para Term Preterm AB Living   4 3     1 3    SAB TAB Ectopic Multiple Live Births   1               Home Medications    Prior to Admission medications   Medication Sig Start Date End Date Taking? Authorizing Provider  Baclofen 5 MG TABS Take 2 tablets by mouth 2 (two) times daily after a meal. 05/26/17   Kerrin ChampagneNitka, James E, MD  cholecalciferol (VITAMIN D) 1000 units tablet Take 1,000 Units by mouth daily.    [provider]  clonazePAM (KLONOPIN) 1 MG tablet Take 1 mg by mouth 2 (two) times daily.    [provider]  FLUoxetine (PROZAC) 20 MG tablet Take 20 mg by mouth daily.    [provider]  gabapentin (NEURONTIN) 400 MG capsule Take 2 tablets at night and one tablet in the AM, after one week then 2 tablets at night and one in the AM and at lunch. 05/26/17   Kerrin ChampagneNitka, James E, MD  gabapentin (NEURONTIN) 800 MG tablet Take 800 mg by mouth 3 (three) times daily.    [provider]  levothyroxine (SYNTHROID, LEVOTHROID) 50 MCG tablet Take 50 mcg by mouth daily before breakfast.    [provider]  methylPREDNISolone (MEDROL DOSEPAK) 4 MG TBPK tablet Take as directed. 05/26/17   Kerrin ChampagneNitka, James E, MD  norethindrone (ORTHO MICRONOR) 0.35 MG tablet Take 1 tablet (0.35 mg total) by mouth daily. 01/13/17   Genia DelLavoie, Marie-Lyne, MD    Family History Family History  Problem Relation Age of Onset  . Breast cancer Maternal Grandmother   . Diabetes Maternal Grandfather     Social History Social History   Tobacco Use  . Smoking status: Never Smoker  . Smokeless tobacco: Never Used  Substance Use Topics  . Alcohol use: No  . Drug use: No     Allergies   Aspirin and Penicillins  Review of Systems Review of Systems  Constitutional: Positive for activity change. Negative for fever.  HENT: Negative.   Respiratory: Negative.   Cardiovascular: Negative for chest pain.  Gastrointestinal: Positive for abdominal pain, diarrhea and nausea. Negative for constipation and vomiting.  Genitourinary: Negative.   Skin: Negative.   Neurological: Negative.   All other systems reviewed and are negative.    Physical Exam Triage Vital Signs ED Triage Vitals [07/14/17 1558]  Enc Vitals Group     BP (!) 145/87     Pulse Rate 81     Resp 20     Temp 98.4 F (36.9 C)     Temp src      SpO2 100 %     Weight      Height      Head Circumference      Peak Flow      Pain Score 10     Pain Loc      Pain Edu?      Excl. in GC?    No data found.  Updated Vital Signs BP (!) 145/87   Pulse 81   Temp 98.4 F (36.9 C)   Resp 20   SpO2 100%   Visual  Acuity Right Eye Distance:   Left Eye Distance:   Bilateral Distance:    Right Eye Near:   Left Eye Near:    Bilateral Near:     Physical Exam  Constitutional: She is oriented to person, place, and time. She appears well-developed and well-nourished. She appears ill. She appears distressed.  Eyes: EOM are normal.  Cardiovascular: Normal rate, regular rhythm and intact distal pulses.  Pulmonary/Chest: Breath sounds normal.  Tachypneic  Abdominal: Bowel sounds are normal. She exhibits no distension. There is tenderness.  Severe tenderness primarily to the left upper quadrant but moderate to severe tenderness in the right lower quadrant and right upper quadrant. Positive for guarding.  Neurological: She is alert and oriented to person, place, and time.  Skin: Skin is warm and dry.  Nursing note and vitals reviewed.    UC Treatments / Results  Labs (all labs ordered are listed, but only abnormal results are displayed) Labs Reviewed - No data to display  EKG  EKG Interpretation None       Radiology No results found.  Procedures Procedures (including critical care time)  Medications Ordered in UC Medications  ondansetron (ZOFRAN-ODT) disintegrating tablet 4 mg (not administered)     Initial Impression / Assessment and Plan / UC Course  I have reviewed the triage vital signs and the nursing notes.  Pertinent labs & imaging results that were available during my care of the patient were reviewed by me and considered in my medical decision making (see chart for details).    Patient is discharged to the emergency department for severe abdominal pain and tenderness. Patient appears to be in moderate distress. She is given Zofran 4 mg sublingual due to the nausea.     Final Clinical Impressions(s) / UC Diagnoses   Final diagnoses:  Left upper quadrant pain  Nausea  Generalized abdominal tenderness without rebound tenderness    ED Discharge Orders    None        Controlled Substance Prescriptions [place] Controlled Substance Registry consulted? Not Applicable   Hayden RasmussenMabe, Amine Adelson, NP 07/14/17 [phone]

## 2017-07-15 ENCOUNTER — Emergency Department (HOSPITAL_COMMUNITY): Payer: Medicare HMO

## 2017-07-15 LAB — I-STAT BETA HCG BLOOD, ED (MC, WL, AP ONLY): I-stat hCG, quantitative: <5 m[IU]/mL (ref <5)

## 2017-07-15 LAB — HCG, QUANTITATIVE, PREGNANCY: hCG, Beta Chain, Quant, S: <1 m[IU]/mL (ref <5)

## 2017-07-15 MED ORDER — IOPAMIDOL (ISOVUE-300) INJECTION 61%
INTRAVENOUS | Status: AC
  Administered 2017-07-15: 100 mL
  Filled 2017-07-15: qty 100

## 2017-07-15 MED ORDER — ONDANSETRON HCL 4 MG/2ML IJ SOLN
4.0000 mg | Freq: Once | INTRAMUSCULAR | Status: AC
  Administered 2017-07-15: 4 mg via INTRAVENOUS
  Filled 2017-07-15: qty 2

## 2017-07-15 MED ORDER — SODIUM CHLORIDE 0.9 % IV BOLUS (SEPSIS)
1000.0000 mL | Freq: Once | INTRAVENOUS | Status: AC
  Administered 2017-07-15: 1000 mL via INTRAVENOUS

## 2017-07-15 MED ORDER — MORPHINE SULFATE (PF) 4 MG/ML IV SOLN
4.0000 mg | Freq: Once | INTRAVENOUS | Status: AC
  Administered 2017-07-15: 4 mg via INTRAVENOUS
  Filled 2017-07-15: qty 1

## 2017-07-15 MED ORDER — ONDANSETRON HCL 4 MG PO TABS: 4.0000 mg | ORAL_TABLET | Freq: Four times a day (QID) | ORAL | 0 refills | Status: DC | PRN

## 2017-07-15 MED ORDER — OXYCODONE-ACETAMINOPHEN 5-325 MG PO TABS: 1.0000 | ORAL_TABLET | ORAL | 0 refills | Status: DC | PRN

## 2017-07-15 NOTE — ED Notes (Signed)
New order for pregnancy placed. Main lab notified and was told they were going to run it now.

## 2017-07-15 NOTE — ED Notes (Signed)
Pt. Return from CT via stretcher. 

## 2017-07-15 NOTE — ED Notes (Signed)
Pt. To CT via stretcher. 

## 2017-07-15 NOTE — Discharge Instructions (Signed)
Su tomografa computarizada mostr un quiste que se ha roto. El lquido del quiste est causando su dolor. Debera mejorar en los prximos dos o Hernandezlandtres das.  Tome ibuprofeno para el dolor menos severo, oxicodona-paracetamol para el dolor ms severo. Regrese al departamento de emergencias si el dolor no se controla Estate manager/land agentadecuadamente en el hogar.

## 2017-07-15 NOTE — ED Notes (Signed)
CT notified about pregnancy result. Pt. Ready for transport.

## 2017-07-15 NOTE — ED Provider Notes (Signed)
[facility] EMERGENCY DEPARTMENT Provider Note   CSN: [id number] Arrival date & time: 07/14/17  1635     History   Chief Complaint Chief Complaint  Patient presents with  . Abdominal Pain    HPI Jamie Garner Jamie Garner is a 46 y.o. female.  The history is provided by the patient. A language interpreter was used.  She has been complaining of left upper abdominal pain for the last 10 days, getting worse over the last 36 hours.  Pain is sharp and severe.  There is some radiation to the right upper quadrant and to the periumbilical area.  Pain is worse with coughing.  There is been nausea but no vomiting.  She denies fever or chills.  She started having diarrhea yesterday.  She has not taken anything for pain.  She has not eaten or drank anything today.  She went to urgent care who directed her here for further evaluation.  She has never had similar pain before.  Past Medical History:  Diagnosis Date  . Anxiety   . Back pain, chronic   . Depression   . Hormone disorder   . Thyroid disease     Patient Active Problem List   Diagnosis Date Noted  . Scoliosis 05/26/2017    Past Surgical History:  Procedure Laterality Date  . BACK SURGERY  2011, 2013, 2015   x3  . HEMORRHOID SURGERY  2017  . PELVIC LAPAROSCOPY     ovarian cyst removed  . TUBAL LIGATION      OB History    Gravida Para Term Preterm AB Living   4 3     1 3    SAB TAB Ectopic Multiple Live Births   1               Home Medications    Prior to Admission medications   Medication Sig Start Date End Date Taking? Authorizing Provider  Baclofen 5 MG TABS Take 2 tablets by mouth 2 (two) times daily after a meal. 05/26/17   Kerrin Champagne, MD  cholecalciferol (VITAMIN D) 1000 units tablet Take 1,000 Units by mouth daily.    [provider]  clonazePAM (KLONOPIN) 1 MG tablet Take 1 mg by mouth 2 (two) times daily.    [provider]  FLUoxetine (PROZAC) 20 MG tablet Take 20 mg  by mouth daily.    [provider]  gabapentin (NEURONTIN) 400 MG capsule Take 2 tablets at night and one tablet in the AM, after one week then 2 tablets at night and one in the AM and at lunch. 05/26/17   Kerrin Champagne, MD  gabapentin (NEURONTIN) 800 MG tablet Take 800 mg by mouth 3 (three) times daily.    [provider]  levothyroxine (SYNTHROID, LEVOTHROID) 50 MCG tablet Take 50 mcg by mouth daily before breakfast.    [provider]  methylPREDNISolone (MEDROL DOSEPAK) 4 MG TBPK tablet Take as directed. 05/26/17   Kerrin Champagne, MD  norethindrone (ORTHO MICRONOR) 0.35 MG tablet Take 1 tablet (0.35 mg total) by mouth daily. 01/13/17   Genia Del, MD    Family History Family History  Problem Relation Age of Onset  . Breast cancer Maternal Grandmother   . Diabetes Maternal Grandfather     Social History Social History   Tobacco Use  . Smoking status: Never Smoker  . Smokeless tobacco: Never Used  Substance Use Topics  . Alcohol use: No  . Drug use: No  Allergies   Aspirin and Penicillins   Review of Systems Review of Systems  All other systems reviewed and are negative.    Physical Exam Updated Vital Signs BP (!) 151/86   Pulse (!) 59   Temp 98.1 F (36.7 C) (Oral)   Resp 18   Ht 5\' 3"  (1.6 m)   Wt 68 kg (150 lb)   LMP 06/21/2017   SpO2 100%   BMI 26.57 kg/m   Physical Exam  Nursing note and vitals reviewed.  46 year old female, resting comfortably and in no acute distress. Vital signs are significant for hypertension. Oxygen saturation is 100%, which is normal. Head is normocephalic and atraumatic. PERRLA, EOMI. Oropharynx is clear. Neck is nontender and supple without adenopathy or JVD. Back is nontender and there is no CVA tenderness. Lungs are clear without rales, wheezes, or rhonchi. Chest is nontender. Heart has regular rate and rhythm without murmur. Abdomen is soft, flat, with moderate to severe tenderness  diffusely.  Tenderness is maximal in the epigastrium and left upper quadrant and left mid abdomen.  There is no rebound or guarding.  There are no masses or hepatosplenomegaly and peristalsis is hypoactive. Extremities have no cyanosis or edema, full range of motion is present. Skin is warm and dry without rash. Neurologic: Mental status is normal, cranial nerves are intact, there are no motor or sensory deficits.  ED Treatments / Results  Labs (all labs ordered are listed, but only abnormal results are displayed) Labs Reviewed  COMPREHENSIVE METABOLIC PANEL - Abnormal; Notable for the following components:      Result Value   Chloride 112 (*)    CO2 19 (*)    All other components within normal limits  LIPASE, BLOOD  CBC  URINALYSIS, ROUTINE W REFLEX MICROSCOPIC  HCG, QUANTITATIVE, PREGNANCY  I-STAT BETA HCG BLOOD, ED ([facility], [facility], [facility] ONLY)  I-STAT BETA HCG BLOOD, ED ([facility], [facility], [facility] ONLY)    Radiology Ct Abdomen Pelvis W Contrast  Result Date: 07/15/2017 CLINICAL DATA:  Severe left upper quadrant pain for 2 weeks. Nausea and diarrhea. EXAM: CT ABDOMEN AND PELVIS WITH CONTRAST TECHNIQUE: Multidetector CT imaging of the abdomen and pelvis was performed using the standard protocol following bolus administration of intravenous contrast. CONTRAST:  100mL ISOVUE-300 IOPAMIDOL (ISOVUE-300) INJECTION 61% COMPARISON:  None. FINDINGS: Lower chest: Mild dependent changes in the lung bases. Hepatobiliary: No focal liver abnormality is seen. No gallstones, gallbladder wall thickening, or biliary dilatation. Pancreas: Unremarkable. No pancreatic ductal dilatation or surrounding inflammatory changes. Spleen: Normal in size without focal abnormality. Adrenals/Urinary Tract: Adrenal glands are unremarkable. Kidneys are normal, without renal calculi, focal lesion, or hydronephrosis. Bladder is unremarkable. Stomach/Bowel: Stomach and small bowel are decompressed. Scattered stool throughout the colon. No colonic  distention or wall thickening. Appendix is normal. Vascular/Lymphatic: No significant vascular findings are present. No enlarged abdominal or pelvic lymph nodes. Reproductive: Uterus and bilateral adnexa are unremarkable. Probable corpus luteal cyst on the left. Small amount of free fluid in the pelvis is likely physiologic. Other: No abdominal wall hernia or abnormality. No abdominopelvic ascites. Musculoskeletal: Postoperative changes with posterior fixation and intervertebral fusion at L4-5 and L5-S1. No destructive bone lesions. IMPRESSION: No acute process demonstrated in the abdomen or pelvis. Corpus luteal cyst on the left ovary with small amount of physiologic free fluid in the pelvis. Electronically Signed   By: Burman NievesWilliam  Stevens M.D.   On: 07/15/2017 05:09    Procedures Procedures (including critical care time)  Medications Ordered in  ED Medications  oxyCODONE-acetaminophen (PERCOCET/ROXICET) 5-325 MG per tablet 1 tablet (1 tablet Oral Given 07/14/17 1736)  oxyCODONE-acetaminophen (PERCOCET/ROXICET) 5-325 MG per tablet (not administered)  sodium chloride 0.9 % bolus 1,000 mL (not administered)  morphine 4 MG/ML injection 4 mg (not administered)  ondansetron (ZOFRAN) injection 4 mg (not administered)     Initial Impression / Assessment and Plan / ED Course  I have reviewed the triage vital signs and the nursing notes.  Pertinent labs & imaging results that were available during my care of the patient were reviewed by me and considered in my medical decision making (see chart for details).  I have personally discussed the CT findings with radiologist.  Abdominal pain of uncertain cause.  Old records are reviewed confirming urgent care visit earlier today.  Laboratory evaluation is unremarkable.  WBC is normal as is metabolic panel.  Urinalysis is normal.  She will be sent for CT of abdomen and pelvis.  She is given IV fluids, morphine, ondansetron.  She has required several doses of  morphine, but is feeling better.  CT scan shows a corpus luteum cyst with some free fluid.  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  No evidence of more severe pathology.  She is discharged with prescription for oxycodone-acetaminophen and ondansetron.  Return precautions discussed.  Final Clinical Impressions(s) / ED Diagnoses   Final diagnoses:  Abdominal pain, unspecified abdominal location  Cyst of left ovary  Mittelschmerz phenomenon    ED Discharge Orders        Ordered    oxyCODONE-acetaminophen (PERCOCET) 5-325 MG tablet  Every 4 hours PRN     07/15/17 0549    ondansetron (ZOFRAN) 4 MG tablet  Every 6 hours PRN     07/15/17 0549       Dione BoozeGlick, Tayton Decaire, MD 07/15/17 [phone]

## 2017-11-02 ENCOUNTER — Emergency Department (HOSPITAL_COMMUNITY)
Admission: EM | Admit: 2017-11-02 | Discharge: 2017-11-02 | Disposition: A | Discharge status: Home / Self Care | Payer: Medicare HMO | Attending: Emergency Medicine | Admitting: Emergency Medicine

## 2017-11-02 ENCOUNTER — Emergency Department (HOSPITAL_COMMUNITY): Payer: Medicare HMO

## 2017-11-02 ENCOUNTER — Encounter (HOSPITAL_COMMUNITY): Payer: Self-pay

## 2017-11-02 DIAGNOSIS — R0602 Shortness of breath: Secondary | ICD-10-CM | POA: Diagnosis present

## 2017-11-02 DIAGNOSIS — M542 Cervicalgia: Secondary | ICD-10-CM

## 2017-11-02 DIAGNOSIS — R200 Anesthesia of skin: Secondary | ICD-10-CM

## 2017-11-02 DIAGNOSIS — M5440 Lumbago with sciatica, unspecified side: Secondary | ICD-10-CM

## 2017-11-02 DIAGNOSIS — Z79899 Other long term (current) drug therapy: Secondary | ICD-10-CM | POA: Diagnosis not present

## 2017-11-02 DIAGNOSIS — J4541 Moderate persistent asthma with (acute) exacerbation: Secondary | ICD-10-CM

## 2017-11-02 DIAGNOSIS — R202 Paresthesia of skin: Secondary | ICD-10-CM

## 2017-11-02 LAB — CBC WITH DIFFERENTIAL/PLATELET
Basophils Absolute: 0 10*3/uL (ref 0.0–0.1)
Basophils Relative: 0 %
Eosinophils Absolute: 0.1 10*3/uL (ref 0.0–0.7)
Eosinophils Relative: 1 %
HCT: 41.6 % (ref 36.0–46.0)
Hemoglobin: 14 g/dL (ref 12.0–15.0)
Lymphocytes Relative: 18 %
Lymphs Abs: 2 10*3/uL (ref 0.7–4.0)
MCH: 29.4 pg (ref 26.0–34.0)
MCHC: 33.7 g/dL (ref 30.0–36.0)
MCV: 87.4 fL (ref 78.0–100.0)
Monocytes Absolute: 0.7 10*3/uL (ref 0.1–1.0)
Monocytes Relative: 6 %
Neutro Abs: 8.3 10*3/uL — ABNORMAL HIGH (ref 1.7–7.7)
Neutrophils Relative %: 75 %
Platelets: 332 10*3/uL (ref 150–400)
RBC: 4.76 MIL/uL (ref 3.87–5.11)
RDW: 12.9 % (ref 11.5–15.5)
WBC: 11.1 10*3/uL — ABNORMAL HIGH (ref 4.0–10.5)

## 2017-11-02 LAB — BASIC METABOLIC PANEL
Anion gap: 11 (ref 5–15)
BUN: 14 mg/dL (ref 6–20)
CO2: 17 mmol/L — ABNORMAL LOW (ref 22–32)
Calcium: 9.3 mg/dL (ref 8.9–10.3)
Chloride: 105 mmol/L (ref 101–111)
Creatinine, Ser: 0.74 mg/dL (ref 0.44–1.00)
GFR calc Af Amer: >60 mL/min (ref >60)
GFR calc non Af Amer: >60 mL/min (ref >60)
Glucose, Bld: 98 mg/dL (ref 65–99)
Potassium: 4.5 mmol/L (ref 3.5–5.1)
Sodium: 133 mmol/L — ABNORMAL LOW (ref 135–145)

## 2017-11-02 LAB — I-STAT TROPONIN, ED: Troponin i, poc: 0 ng/mL (ref 0.00–0.08)

## 2017-11-02 MED ORDER — BENZONATATE 100 MG PO CAPS: 100.0000 mg | ORAL_CAPSULE | Freq: Three times a day (TID) | ORAL | 0 refills | Status: DC

## 2017-11-02 MED ORDER — IPRATROPIUM-ALBUTEROL 0.5-2.5 (3) MG/3ML IN SOLN
3.0000 mL | Freq: Once | RESPIRATORY_TRACT | Status: AC
  Administered 2017-11-02: 3 mL via RESPIRATORY_TRACT
  Filled 2017-11-02: qty 3

## 2017-11-02 MED ORDER — ACETAMINOPHEN 325 MG PO TABS: 650.0000 mg | ORAL_TABLET | Freq: Once | ORAL | Status: DC

## 2017-11-02 MED ORDER — TRAMADOL HCL 50 MG PO TABS: 50.0000 mg | ORAL_TABLET | Freq: Four times a day (QID) | ORAL | 0 refills | Status: DC | PRN

## 2017-11-02 MED ORDER — METHYLPREDNISOLONE SODIUM SUCC 125 MG IJ SOLR
125.0000 mg | Freq: Once | INTRAMUSCULAR | Status: AC
  Administered 2017-11-02: 125 mg via INTRAVENOUS
  Filled 2017-11-02: qty 2

## 2017-11-02 MED ORDER — METHYLPREDNISOLONE 4 MG PO TBPK: ORAL_TABLET | ORAL | 0 refills | Status: DC

## 2017-11-02 MED ORDER — TRAMADOL HCL 50 MG PO TABS: 50.0000 mg | ORAL_TABLET | Freq: Once | ORAL | Status: DC

## 2017-11-02 MED ORDER — ACETAMINOPHEN 325 MG PO TABS
650.0000 mg | ORAL_TABLET | Freq: Once | ORAL | Status: AC
Start: 2017-11-02 — End: 2017-11-02
  Administered 2017-11-02: 650 mg via ORAL
  Filled 2017-11-02: qty 2

## 2017-11-02 MED ORDER — ALBUTEROL (5 MG/ML) CONTINUOUS INHALATION SOLN
10.0000 mg/h | INHALATION_SOLUTION | Freq: Once | RESPIRATORY_TRACT | Status: AC
  Administered 2017-11-02: 10 mg/h via RESPIRATORY_TRACT
  Filled 2017-11-02: qty 20

## 2017-11-02 MED ORDER — ALBUTEROL SULFATE (2.5 MG/3ML) 0.083% IN NEBU
5.0000 mg | INHALATION_SOLUTION | Freq: Once | RESPIRATORY_TRACT | Status: AC
  Administered 2017-11-02: 5 mg via RESPIRATORY_TRACT

## 2017-11-02 NOTE — ED Triage Notes (Signed)
Pt arrives to ED with complaints of sob with diminished breath sounds. PT reports hx of asthma and reports using breathing tx at home with no relief.

## 2017-11-02 NOTE — ED Provider Notes (Signed)
Patient placed in Quick Look pathway, seen and evaluated   Chief Complaint: SOB  HPI:   Patient with history of asthma presents with 1 week of progressively worsening shortness of breath.  Subjective fevers and chills at home as well as nonproductive cough.  She is a non-smoker.  No recent travel or surgeries, no hemoptysis, no prior history of DVT or PE.  She has tried 3+ albuterol nebulizers daily without significant relief of her symptoms.  Also endorses chest pain.  ROS: +SOB +cough +chest pain +fevers +chills -leg swelling  Physical Exam:   Gen: No distress  Neuro: Awake and Alert  Skin: Warm    Focused Exam: Audible expiratory wheezing, patient with labored breathing and retractions.  Diminished breath sounds globally.  Equal rise and fall of chest. 2+ radial and DP/PT pulses bl, negative Homan's bl, no lower extremity edema.   Initiation of care has begun. The patient has been counseled on the process, plan, and necessity for staying for the completion/evaluation, and the remainder of the medical screening examination     Jeanie SewerFawze, Aurie Harroun A, PA-C 11/02/17 1529    Pricilla LovelessGoldston, Scott, MD 11/03/17 [phone]

## 2017-11-02 NOTE — ED Notes (Signed)
Provider aware of complaints.

## 2017-11-02 NOTE — ED Notes (Signed)
Pt ambulated on own ability for pulse ox reading. Maintained 99-100%. Coughed occasionally. Strong on feat and steady gait.

## 2017-11-02 NOTE — ED Notes (Signed)
Pt verbalized understanding discharge instructions and denies any further needs or questions at this time. VS stable, ambulatory and steady gait.   Pt refused aid of translator for discharge. Pt insisted on leaving.

## 2017-11-02 NOTE — ED Notes (Signed)
Patient transported to X-ray 

## 2017-11-02 NOTE — Discharge Instructions (Signed)
Please follow up with your doctor for further evaluation of your condition.  Take medications as prescribed.  Return if you have any concerns or if your condition worsen.

## 2017-11-02 NOTE — ED Provider Notes (Signed)
[facility] EMERGENCY DEPARTMENT Provider Note   CSN: [id number] Arrival date & time: 11/02/17  1508     History   Chief Complaint Chief Complaint  Patient presents with  . Respiratory Distress    HPI Jamie Garner is a 47 y.o. female.  HPI   47 year old Hispanic speaking female with history of asthma, anxiety, thyroid disease presenting complaining of shortness of breath.  History obtained through language interpreter.  Patient report for the past week she has had progressive worsening shortness of breath, wheezing, subjective fever and chills at home cough.  She also complaining of a throbbing headache that is gradual onset and has been persistent.  She has been using her inhaler at home several times daily without adequate relief.  Pleuritic chest pain from a cough.  She denies any prior history of PE DVT, no recent surgery, prolonged travel, unilateral leg swelling or calf pain, active cancer or hemoptysis.  Past Medical History:  Diagnosis Date  . Anxiety   . Back pain, chronic   . Depression   . Hormone disorder   . Thyroid disease     Patient Active Problem List   Diagnosis Date Noted  . Scoliosis 05/26/2017    Past Surgical History:  Procedure Laterality Date  . BACK SURGERY  2011, 2013, 2015   x3  . HEMORRHOID SURGERY  2017  . PELVIC LAPAROSCOPY     ovarian cyst removed  . TUBAL LIGATION      OB History    Gravida Para Term Preterm AB Living   4 3     1 3    SAB TAB Ectopic Multiple Live Births   1               Home Medications    Prior to Admission medications   Medication Sig Start Date End Date Taking? Authorizing Provider  Baclofen 5 MG TABS Take 2 tablets by mouth 2 (two) times daily after a meal. 05/26/17   Kerrin Champagne, MD  cholecalciferol (VITAMIN D) 1000 units tablet Take 1,000 Units by mouth daily.    [provider]  clonazePAM (KLONOPIN) 1 MG tablet Take 1 mg by mouth 2 (two) times daily.     [provider]  FLUoxetine (PROZAC) 20 MG tablet Take 20 mg by mouth daily.    [provider]  gabapentin (NEURONTIN) 400 MG capsule Take 2 tablets at night and one tablet in the AM, after one week then 2 tablets at night and one in the AM and at lunch. 05/26/17   Kerrin Champagne, MD  gabapentin (NEURONTIN) 800 MG tablet Take 800 mg by mouth 3 (three) times daily.    [provider]  levothyroxine (SYNTHROID, LEVOTHROID) 50 MCG tablet Take 50 mcg by mouth daily before breakfast.    [provider]  methylPREDNISolone (MEDROL DOSEPAK) 4 MG TBPK tablet Take as directed. 05/26/17   Kerrin Champagne, MD  norethindrone (ORTHO MICRONOR) 0.35 MG tablet Take 1 tablet (0.35 mg total) by mouth daily. 01/13/17   Genia Del, MD  ondansetron (ZOFRAN) 4 MG tablet Take 1 tablet (4 mg total) every 6 (six) hours as needed by mouth for nausea or vomiting. 07/15/17   Dione Booze, MD  oxyCODONE-acetaminophen (PERCOCET) 5-325 MG tablet Take 1 tablet every 4 (four) hours as needed by mouth for moderate pain. 07/15/17   Dione Booze, MD    Family History Family History  Problem Relation Age of Onset  . Breast  cancer Maternal Grandmother   . Diabetes Maternal Grandfather     Social History Social History   Tobacco Use  . Smoking status: Never Smoker  . Smokeless tobacco: Never Used  Substance Use Topics  . Alcohol use: No  . Drug use: No     Allergies   Aspirin and Penicillins   Review of Systems Review of Systems  All other systems reviewed and are negative.    Physical Exam Updated Vital Signs BP (!) 154/105   Pulse (!) 113   Temp 98.5 F (36.9 C) (Oral)   Resp (!) 24   SpO2 100%   Physical Exam  Constitutional: She is oriented to person, place, and time. She appears well-developed and well-nourished. No distress.  Patient appears uncomfortable and in moderate respiratory discomfort but not tripoding.  HENT:  Head: Atraumatic.  Right Ear: External  ear normal.  Left Ear: External ear normal.  Mouth/Throat: Oropharynx is clear and moist.  Eyes: Conjunctivae are normal.  Neck: Normal range of motion. Neck supple.  No nuchal rigidity  Cardiovascular:  Tachycardia without murmur rubs or gallops  Pulmonary/Chest:  Decreased breath sounds with overt wheezes, no rales or rhonchi  Abdominal: Soft. She exhibits no distension. There is no tenderness.  Musculoskeletal: She exhibits no edema.  Neurological: She is alert and oriented to person, place, and time.  Skin: No rash noted.  Psychiatric: She has a normal mood and affect.  Nursing note and vitals reviewed.    ED Treatments / Results  Labs (all labs ordered are listed, but only abnormal results are displayed) Labs Reviewed  BASIC METABOLIC PANEL - Abnormal; Notable for the following components:      Result Value   Sodium 133 (*)    CO2 17 (*)    All other components within normal limits  CBC WITH DIFFERENTIAL/PLATELET - Abnormal; Notable for the following components:   WBC 11.1 (*)    Neutro Abs 8.3 (*)    All other components within normal limits  I-STAT TROPONIN, ED    EKG  EKG Interpretation  Date/Time:  Tuesday November 02 2017 15:46:34 EST Ventricular Rate:  104 PR Interval:    QRS Duration: 80 QT Interval:  334 QTC Calculation: 440 R Axis:   71 Text Interpretation:  Sinus tachycardia Borderline T wave abnormalities No previous ECGs available Confirmed by Vanetta Mulders [phone]) on 11/02/2017 5:32:23 PM       Radiology Dg Chest 2 View  Result Date: 11/02/2017 CLINICAL DATA:  Shortness of breath today.  History of asthma. EXAM: CHEST  2 VIEW COMPARISON:  None. FINDINGS: The lungs are clear. The chest is mildly hyperexpanded. No pneumothorax or pleural fluid. Heart size is normal. No bony abnormality. IMPRESSION: Mild bilateral hyperexpansion.  Otherwise negative. Electronically Signed   By: Drusilla Kanner M.D.   On: 11/02/2017 17:28     Procedures Procedures (including critical care time)  Medications Ordered in ED Medications  traMADol (ULTRAM) tablet 50 mg (not administered)  ipratropium-albuterol (DUONEB) 0.5-2.5 (3) MG/3ML nebulizer solution 3 mL (3 mLs Nebulization Given 11/02/17 1551)  albuterol (PROVENTIL) (2.5 MG/3ML) 0.083% nebulizer solution 5 mg (5 mg Nebulization Given 11/02/17 1526)  methylPREDNISolone sodium succinate (SOLU-MEDROL) 125 mg/2 mL injection 125 mg (125 mg Intravenous Given 11/02/17 1612)  albuterol (PROVENTIL,VENTOLIN) solution continuous neb (10 mg/hr Nebulization Given 11/02/17 1617)  acetaminophen (TYLENOL) tablet 650 mg (650 mg Oral Given 11/02/17 1800)     Initial Impression / Assessment and Plan / ED Course  I have  reviewed the triage vital signs and the nursing notes.  Pertinent labs & imaging results that were available during my care of the patient were reviewed by me and considered in my medical decision making (see chart for details).     BP (!) 142/86   Pulse 93   Temp 98.5 F (36.9 C) (Oral)   Resp 16   SpO2 98%    Final Clinical Impressions(s) / ED Diagnoses   Final diagnoses:  Moderate persistent asthma with exacerbation    ED Discharge Orders        Ordered    methylPREDNISolone (MEDROL DOSEPAK) 4 MG TBPK tablet     11/02/17 1929    benzonatate (TESSALON) 100 MG capsule  Every 8 hours     11/02/17 1929    traMADol (ULTRAM) 50 MG tablet  Every 6 hours PRN     11/02/17 1929     4:16 PM Patient here with viral symptoms along with respiratory discomfort likely from an asthma exacerbation.  She received breathing treatment with some improvement but will benefit from additional duo nebs.  Workup initiated.  7:25 PM Patient received continues nebulizing treatment and felt better.  She also received steroid here.  EKG without concerning changes, normal troponin, labs are reassuring and chest x-ray without concerning feature.  She ambulates well without evidence of  hypoxia.  I encourage patient to follow-up with her PCP for further management.  Patient sent home with symptomatic treatment.  Suspect asthma exacerbation secondary to viral illness.  Return precautions discussed. In order to decrease risk of narcotic abuse. Pt's record were checked using the  Controlled Substance database.     Fayrene Helperran, Lynnetta Tom, PA-C 11/02/17 1933    Vanetta MuldersZackowski, Scott, MD 11/03/17 [phone]

## 2018-01-05 ENCOUNTER — Other Ambulatory Visit (INDEPENDENT_AMBULATORY_CARE_PROVIDER_SITE_OTHER): Payer: Self-pay | Admitting: Specialist

## 2018-01-05 DIAGNOSIS — M542 Cervicalgia: Secondary | ICD-10-CM

## 2018-01-05 DIAGNOSIS — R200 Anesthesia of skin: Secondary | ICD-10-CM

## 2018-01-05 DIAGNOSIS — R202 Paresthesia of skin: Secondary | ICD-10-CM

## 2018-01-05 MED ORDER — GABAPENTIN 400 MG PO CAPS: ORAL_CAPSULE | ORAL | 6 refills | Status: AC

## 2018-01-12 ENCOUNTER — Encounter: Payer: Medicare HMO | Admitting: Obstetrics & Gynecology

## 2018-01-12 DIAGNOSIS — Z0289 Encounter for other administrative examinations: Secondary | ICD-10-CM

## 2018-05-24 ENCOUNTER — Other Ambulatory Visit: Payer: Self-pay

## 2018-05-24 ENCOUNTER — Emergency Department (HOSPITAL_COMMUNITY)
Admission: EM | Admit: 2018-05-24 | Discharge: 2018-05-25 | Disposition: A | Discharge status: Home / Self Care | Payer: Medicare HMO | Attending: Emergency Medicine | Admitting: Emergency Medicine

## 2018-05-24 ENCOUNTER — Encounter (HOSPITAL_COMMUNITY): Payer: Self-pay | Admitting: Emergency Medicine

## 2018-05-24 DIAGNOSIS — Z79899 Other long term (current) drug therapy: Secondary | ICD-10-CM | POA: Insufficient documentation

## 2018-05-24 DIAGNOSIS — K5900 Constipation, unspecified: Secondary | ICD-10-CM | POA: Insufficient documentation

## 2018-05-24 DIAGNOSIS — R197 Diarrhea, unspecified: Secondary | ICD-10-CM | POA: Insufficient documentation

## 2018-05-24 DIAGNOSIS — R1031 Right lower quadrant pain: Secondary | ICD-10-CM | POA: Insufficient documentation

## 2018-05-24 DIAGNOSIS — R110 Nausea: Secondary | ICD-10-CM | POA: Diagnosis not present

## 2018-05-24 LAB — URINALYSIS, ROUTINE W REFLEX MICROSCOPIC
BILIRUBIN URINE: NEGATIVE
GLUCOSE, UA: NEGATIVE mg/dL
HGB URINE DIPSTICK: NEGATIVE
KETONES UR: NEGATIVE mg/dL
Leukocytes, UA: NEGATIVE
Nitrite: NEGATIVE
PROTEIN: NEGATIVE mg/dL
Specific Gravity, Urine: 1.012 (ref 1.005–1.030)
pH: 7 (ref 5.0–8.0)

## 2018-05-24 LAB — CBC
HCT: 39.7 % (ref 36.0–46.0)
Hemoglobin: 13.1 g/dL (ref 12.0–15.0)
MCH: 30.4 pg (ref 26.0–34.0)
MCHC: 33 g/dL (ref 30.0–36.0)
MCV: 92.1 fL (ref 78.0–100.0)
Platelets: 293 10*3/uL (ref 150–400)
RBC: 4.31 MIL/uL (ref 3.87–5.11)
RDW: 13.1 % (ref 11.5–15.5)
WBC: 7.6 10*3/uL (ref 4.0–10.5)

## 2018-05-24 LAB — COMPREHENSIVE METABOLIC PANEL
ALK PHOS: 54 U/L (ref 38–126)
ALT: 16 U/L (ref 0–44)
AST: 20 U/L (ref 15–41)
Albumin: 3.7 g/dL (ref 3.5–5.0)
Anion gap: 10 (ref 5–15)
BUN: 19 mg/dL (ref 6–20)
CALCIUM: 9.4 mg/dL (ref 8.9–10.3)
CHLORIDE: 108 mmol/L (ref 98–111)
CO2: 18 mmol/L — AB (ref 22–32)
CREATININE: 0.78 mg/dL (ref 0.44–1.00)
GFR calc Af Amer: >60 mL/min (ref >60)
GFR calc non Af Amer: >60 mL/min (ref >60)
Glucose, Bld: 88 mg/dL (ref 70–99)
Potassium: 4.3 mmol/L (ref 3.5–5.1)
SODIUM: 136 mmol/L (ref 135–145)
Total Bilirubin: 0.3 mg/dL (ref 0.3–1.2)
Total Protein: 6.5 g/dL (ref 6.5–8.1)

## 2018-05-24 LAB — LIPASE, BLOOD: LIPASE: 56 U/L — AB (ref 11–51)

## 2018-05-24 LAB — I-STAT BETA HCG BLOOD, ED (MC, WL, AP ONLY): I-stat hCG, quantitative: <5 m[IU]/mL (ref <5)

## 2018-05-24 NOTE — ED Triage Notes (Signed)
Pt c/o abdominal pain and diarrhea that started this morning.

## 2018-05-25 ENCOUNTER — Emergency Department (HOSPITAL_COMMUNITY): Payer: Medicare HMO

## 2018-05-25 MED ORDER — POLYETHYLENE GLYCOL 3350 17 G PO PACK: PACK | ORAL | 0 refills | Status: AC

## 2018-05-25 MED ORDER — IOPAMIDOL (ISOVUE-300) INJECTION 61%: 100.0000 mL | Freq: Once | INTRAVENOUS | Status: DC | PRN

## 2018-05-25 MED ORDER — IOHEXOL 300 MG/ML  SOLN
100.0000 mL | Freq: Once | INTRAMUSCULAR | Status: AC | PRN
  Administered 2018-05-25: 100 mL via INTRAVENOUS

## 2018-05-25 MED ORDER — ONDANSETRON HCL 4 MG/2ML IJ SOLN
4.0000 mg | Freq: Once | INTRAMUSCULAR | Status: AC
  Administered 2018-05-25: 4 mg via INTRAVENOUS
  Filled 2018-05-25: qty 2

## 2018-05-25 MED ORDER — MORPHINE SULFATE (PF) 4 MG/ML IV SOLN
4.0000 mg | Freq: Once | INTRAVENOUS | Status: AC
  Administered 2018-05-25: 4 mg via INTRAVENOUS
  Filled 2018-05-25: qty 1

## 2018-05-25 NOTE — ED Notes (Signed)
Patient transported to CT 

## 2018-05-25 NOTE — ED Provider Notes (Signed)
[facility] EMERGENCY DEPARTMENT Provider Note   CSN: [id number] Arrival date & time: 05/24/18  2008     History   Chief Complaint Chief Complaint  Patient presents with  . Abdominal Pain    HPI Jamie Garner is a 47 y.o. female.  Patient presents with complaint of RLQ abdominal pain that started as she went to bed tonight. She has had loose stools, and nausea without vomiting. No fever. She denies symptoms during the day yesterday and feels this came on quickly. No urinary symptoms, vaginal discharge or bleeding. No history of similar symptoms.   The history is provided by the patient and the spouse. A language interpreter was used (Via Video interpreter.).    Past Medical History:  Diagnosis Date  . Anxiety   . Back pain, chronic   . Depression   . Hormone disorder   . Thyroid disease     Patient Active Problem List   Diagnosis Date Noted  . Scoliosis 05/26/2017    Past Surgical History:  Procedure Laterality Date  . BACK SURGERY  2011, 2013, 2015   x3  . HEMORRHOID SURGERY  2017  . PELVIC LAPAROSCOPY     ovarian cyst removed  . TUBAL LIGATION       OB History    Gravida  4   Para  3   Term      Preterm      AB  1   Living  3     SAB  1   TAB      Ectopic      Multiple      Live Births               Home Medications    Prior to Admission medications   Medication Sig Start Date End Date Taking? Authorizing Provider  Baclofen 5 MG TABS Take 2 tablets by mouth 2 (two) times daily after a meal. 05/26/17   Kerrin Champagne, MD  benzonatate (TESSALON) 100 MG capsule Take 1 capsule (100 mg total) by mouth every 8 (eight) hours. 11/02/17   Fayrene Helper, PA-C  cholecalciferol (VITAMIN D) 1000 units tablet Take 1,000 Units by mouth daily.    [provider]  clonazePAM (KLONOPIN) 1 MG tablet Take 1 mg by mouth 2 (two) times daily.    [provider]  FLUoxetine (PROZAC) 20 MG tablet Take 20 mg by  mouth daily.    [provider]  gabapentin (NEURONTIN) 400 MG capsule Take 2 tablets at night and one tablet in the AM, after one week then 2 tablets at night and one in the AM and at lunch. 01/05/18   Kerrin Champagne, MD  gabapentin (NEURONTIN) 800 MG tablet Take 800 mg by mouth 3 (three) times daily.    [provider]  levothyroxine (SYNTHROID, LEVOTHROID) 50 MCG tablet Take 50 mcg by mouth daily before breakfast.    [provider]  methylPREDNISolone (MEDROL DOSEPAK) 4 MG TBPK tablet Take as directed. 11/02/17   Fayrene Helper, PA-C  norethindrone (ORTHO MICRONOR) 0.35 MG tablet Take 1 tablet (0.35 mg total) by mouth daily. 01/13/17   Genia Del, MD  ondansetron (ZOFRAN) 4 MG tablet Take 1 tablet (4 mg total) every 6 (six) hours as needed by mouth for nausea or vomiting. 07/15/17   Dione Booze, MD  oxyCODONE-acetaminophen (PERCOCET) 5-325 MG tablet Take 1 tablet every 4 (four) hours as needed by mouth for moderate pain. 07/15/17  Dione Booze, MD  traMADol (ULTRAM) 50 MG tablet Take 1 tablet (50 mg total) by mouth every 6 (six) hours as needed for moderate pain or severe pain. 11/02/17   Fayrene Helper, PA-C    Family History Family History  Problem Relation Age of Onset  . Breast cancer Maternal Grandmother   . Diabetes Maternal Grandfather     Social History Social History   Tobacco Use  . Smoking status: Never Smoker  . Smokeless tobacco: Never Used  Substance Use Topics  . Alcohol use: No  . Drug use: No     Allergies   Aspirin and Penicillins   Review of Systems Review of Systems  Constitutional: Negative for chills and fever.  HENT: Negative.   Respiratory: Negative.   Cardiovascular: Negative.   Gastrointestinal: Positive for abdominal pain, diarrhea and nausea. Negative for constipation.  Genitourinary: Negative.   Musculoskeletal: Negative.   Skin: Negative.   Neurological: Negative.      Physical Exam Updated Vital Signs BP (!)  143/91   Pulse 62   Resp 17   SpO2 100%   Physical Exam  Constitutional: She appears well-developed and well-nourished.  Uncomfortable appearing.  HENT:  Head: Normocephalic.  Neck: Normal range of motion. Neck supple.  Cardiovascular: Normal rate and regular rhythm.  Pulmonary/Chest: Effort normal and breath sounds normal.  Abdominal: Soft. Bowel sounds are normal. She exhibits no distension. There is tenderness in the right lower quadrant. There is no rigidity, no rebound and no guarding.  Musculoskeletal: Normal range of motion.  Neurological: She is alert. No cranial nerve deficit.  Skin: Skin is warm and dry. No rash noted.  Psychiatric: She has a normal mood and affect.  Nursing note and vitals reviewed.    ED Treatments / Results  Labs (all labs ordered are listed, but only abnormal results are displayed) Labs Reviewed  LIPASE, BLOOD - Abnormal; Notable for the following components:      Result Value   Lipase 56 (*)    All other components within normal limits  COMPREHENSIVE METABOLIC PANEL - Abnormal; Notable for the following components:   CO2 18 (*)    All other components within normal limits  URINALYSIS, ROUTINE W REFLEX MICROSCOPIC - Abnormal; Notable for the following components:   Color, Urine STRAW (*)    All other components within normal limits  CBC  I-STAT BETA HCG BLOOD, ED ([facility], [facility], [facility] ONLY)   Results for orders placed or performed during the hospital encounter of 05/24/18  Lipase, blood  Result Value Ref Range   Lipase 56 (H) 11 - 51 U/L  Comprehensive metabolic panel  Result Value Ref Range   Sodium 136 135 - 145 mmol/L   Potassium 4.3 3.5 - 5.1 mmol/L   Chloride 108 98 - 111 mmol/L   CO2 18 (L) 22 - 32 mmol/L   Glucose, Bld 88 70 - 99 mg/dL   BUN 19 6 - 20 mg/dL   Creatinine, Ser 0.98 0.44 - 1.00 mg/dL   Calcium 9.4 8.9 - 11.9 mg/dL   Total Protein 6.5 6.5 - 8.1 g/dL   Albumin 3.7 3.5 - 5.0 g/dL   AST 20 15 - 41 U/L   ALT 16 0 - 44 U/L    Alkaline Phosphatase 54 38 - 126 U/L   Total Bilirubin 0.3 0.3 - 1.2 mg/dL   GFR calc non Af Amer >60 >60 mL/min   GFR calc Af Amer >60 >60 mL/min   Anion gap 10 5 -  15  CBC  Result Value Ref Range   WBC 7.6 4.0 - 10.5 K/uL   RBC 4.31 3.87 - 5.11 MIL/uL   Hemoglobin 13.1 12.0 - 15.0 g/dL   HCT 16.139.7 09.636.0 - 04.546.0 %   MCV 92.1 78.0 - 100.0 fL   MCH 30.4 26.0 - 34.0 pg   MCHC 33.0 30.0 - 36.0 g/dL   RDW 40.913.1 81.111.5 - 91.415.5 %   Platelets 293 150 - 400 K/uL  Urinalysis, Routine w reflex microscopic  Result Value Ref Range   Color, Urine STRAW (A) YELLOW   APPearance CLEAR CLEAR   Specific Gravity, Urine 1.012 1.005 - 1.030   pH 7.0 5.0 - 8.0   Glucose, UA NEGATIVE NEGATIVE mg/dL   Hgb urine dipstick NEGATIVE NEGATIVE   Bilirubin Urine NEGATIVE NEGATIVE   Ketones, ur NEGATIVE NEGATIVE mg/dL   Protein, ur NEGATIVE NEGATIVE mg/dL   Nitrite NEGATIVE NEGATIVE   Leukocytes, UA NEGATIVE NEGATIVE  I-Stat beta hCG blood, ED  Result Value Ref Range   I-stat hCG, quantitative <5.0 <5 mIU/mL   Comment 3            EKG None  Radiology No results found. Results for orders placed or performed during the hospital encounter of 05/24/18  Lipase, blood  Result Value Ref Range   Lipase 56 (H) 11 - 51 U/L  Comprehensive metabolic panel  Result Value Ref Range   Sodium 136 135 - 145 mmol/L   Potassium 4.3 3.5 - 5.1 mmol/L   Chloride 108 98 - 111 mmol/L   CO2 18 (L) 22 - 32 mmol/L   Glucose, Bld 88 70 - 99 mg/dL   BUN 19 6 - 20 mg/dL   Creatinine, Ser 7.820.78 0.44 - 1.00 mg/dL   Calcium 9.4 8.9 - 95.610.3 mg/dL   Total Protein 6.5 6.5 - 8.1 g/dL   Albumin 3.7 3.5 - 5.0 g/dL   AST 20 15 - 41 U/L   ALT 16 0 - 44 U/L   Alkaline Phosphatase 54 38 - 126 U/L   Total Bilirubin 0.3 0.3 - 1.2 mg/dL   GFR calc non Af Amer >60 >60 mL/min   GFR calc Af Amer >60 >60 mL/min   Anion gap 10 5 - 15  CBC  Result Value Ref Range   WBC 7.6 4.0 - 10.5 K/uL   RBC 4.31 3.87 - 5.11 MIL/uL   Hemoglobin 13.1 12.0 -  15.0 g/dL   HCT 21.339.7 08.636.0 - 57.846.0 %   MCV 92.1 78.0 - 100.0 fL   MCH 30.4 26.0 - 34.0 pg   MCHC 33.0 30.0 - 36.0 g/dL   RDW 46.913.1 62.911.5 - 52.815.5 %   Platelets 293 150 - 400 K/uL  Urinalysis, Routine w reflex microscopic  Result Value Ref Range   Color, Urine STRAW (A) YELLOW   APPearance CLEAR CLEAR   Specific Gravity, Urine 1.012 1.005 - 1.030   pH 7.0 5.0 - 8.0   Glucose, UA NEGATIVE NEGATIVE mg/dL   Hgb urine dipstick NEGATIVE NEGATIVE   Bilirubin Urine NEGATIVE NEGATIVE   Ketones, ur NEGATIVE NEGATIVE mg/dL   Protein, ur NEGATIVE NEGATIVE mg/dL   Nitrite NEGATIVE NEGATIVE   Leukocytes, UA NEGATIVE NEGATIVE  I-Stat beta hCG blood, ED  Result Value Ref Range   I-stat hCG, quantitative <5.0 <5 mIU/mL   Comment 3           Ct Abdomen Pelvis W Contrast  Result Date: 05/25/2018 CLINICAL DATA:  Abdominal pain and  diarrhea starting this morning query appendicitis. EXAM: CT ABDOMEN AND PELVIS WITH CONTRAST TECHNIQUE: Multidetector CT imaging of the abdomen and pelvis was performed using the standard protocol following bolus administration of intravenous contrast. CONTRAST:  OMNIPAQUE IOHEXOL 300 MG/ML  SOLN COMPARISON:  Eleven 18 FINDINGS: LOWER CHEST: Lung bases are clear. Included heart size is normal. No pericardial effusion. HEPATOBILIARY: Liver and gallbladder are normal. PANCREAS: Normal. Partial volume of adjacent peripancreatic fat along the ventral surface of the pancreatic head, series 3/42. No inflammation or enhancing mass. No ductal dilatation. SPLEEN: Normal. ADRENALS/URINARY TRACT: Kidneys are orthotopic, demonstrating symmetric enhancement. No nephrolithiasis, hydronephrosis or solid renal masses. Urinary bladder is partially distended and unremarkable. Normal adrenal glands. STOMACH/BOWEL: The stomach, small and large bowel are normal in course and caliber without inflammatory changes. Increased stool retention within the colon compatible with constipation. No bowel  obstruction. Normal appendix. VASCULAR/LYMPHATIC: Aortoiliac vessels are normal in course and caliber. No lymphadenopathy by CT size criteria. REPRODUCTIVE: Normal. OTHER: No intraperitoneal free fluid or free air. MUSCULOSKELETAL: Nonacute. Prior intervertebral lumbar fusion at L4-5 and L5-S1 with changes of posterior decompression. Pedicle screws noted at L4 and S1. No aggressive osseous lesions. IMPRESSION: Normal appendix. Increased fecal retention compatible with constipation. Electronically Signed   By: Tollie Eth M.D.   On: 05/25/2018 02:37    Procedures Procedures (including critical care time)  Medications Ordered in ED Medications  morphine 4 MG/ML injection 4 mg (4 mg Intravenous Given 05/25/18 0140)  ondansetron (ZOFRAN) injection 4 mg (4 mg Intravenous Given 05/25/18 0140)     Initial Impression / Assessment and Plan / ED Course  I have reviewed the triage vital signs and the nursing notes.  Pertinent labs & imaging results that were available during my care of the patient were reviewed by me and considered in my medical decision making (see chart for details).     Patient here for quick onset abdominal pain focal to the RLQ tonight. No fever. She has nausea and some loose stool.   The patient's pain was managed with Morphine. CT pending to evaluate for appy. Labs are reassuring.   On recheck, the patient's pain is still resolved. CT shows normal appendix, and also shows findings compatible with constipation. Will treat with miralax.   Do not feel there is evidence for acute infection, infarction or obstruction. Return precautions discussed via interpreter. Patient and spouse are comfortable with plan of discharge.   Final Clinical Impressions(s) / ED Diagnoses   Final diagnoses:  None   1. Abdominal pain 2. Constipation  ED Discharge Orders    None       Elpidio Anis, Cordelia Poche 05/28/18 1610    Nira Conn, MD 05/29/18 [phone]

## 2018-05-25 NOTE — ED Notes (Signed)
Patient verbalizes understanding of discharge instructions. Opportunity for questioning and answers were provided. Armband removed by staff, pt discharged from ED ambulatory.   

## 2018-09-07 IMAGING — MG DIGITAL SCREENING BILATERAL MAMMOGRAM WITH CAD
4 series · 4 of 4 positions shown · non-contrast
Comparison: None.

CLINICAL DATA: Screening.

EXAM:
DIGITAL SCREENING BILATERAL MAMMOGRAM WITH CAD

[L CC]
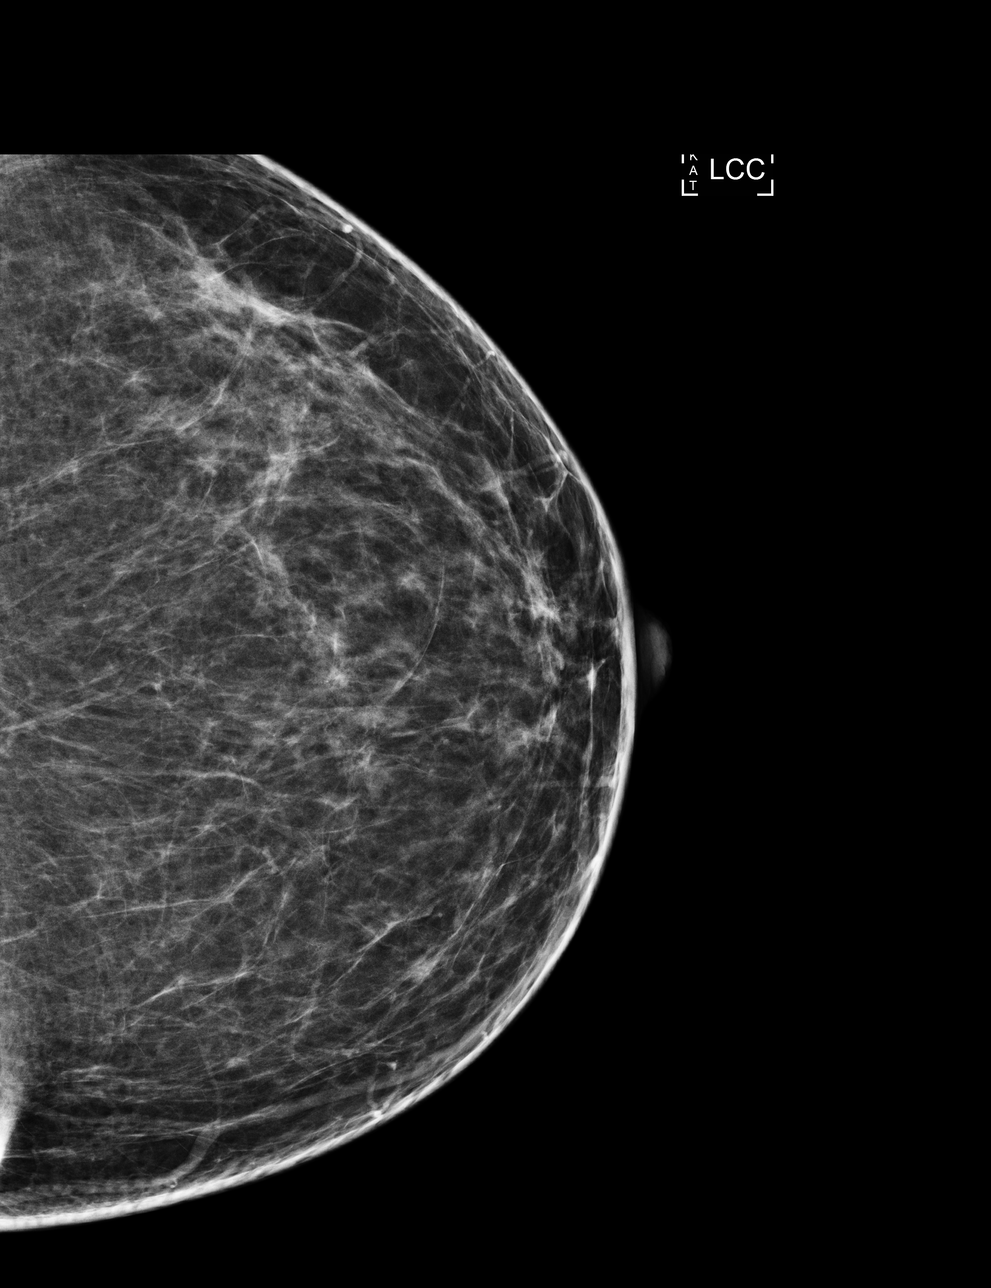

[R CC]
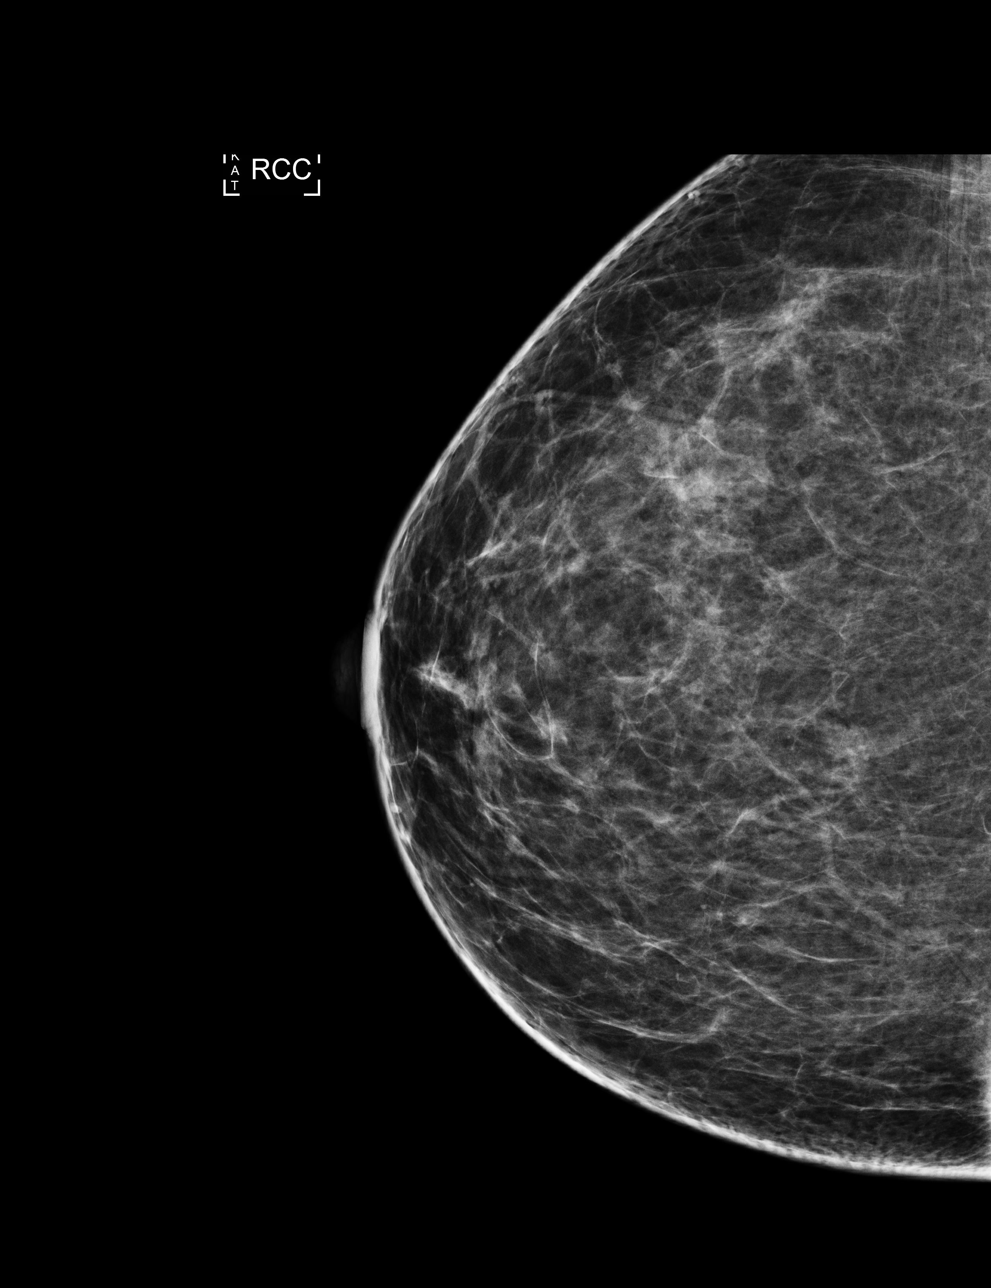

[L MLO]
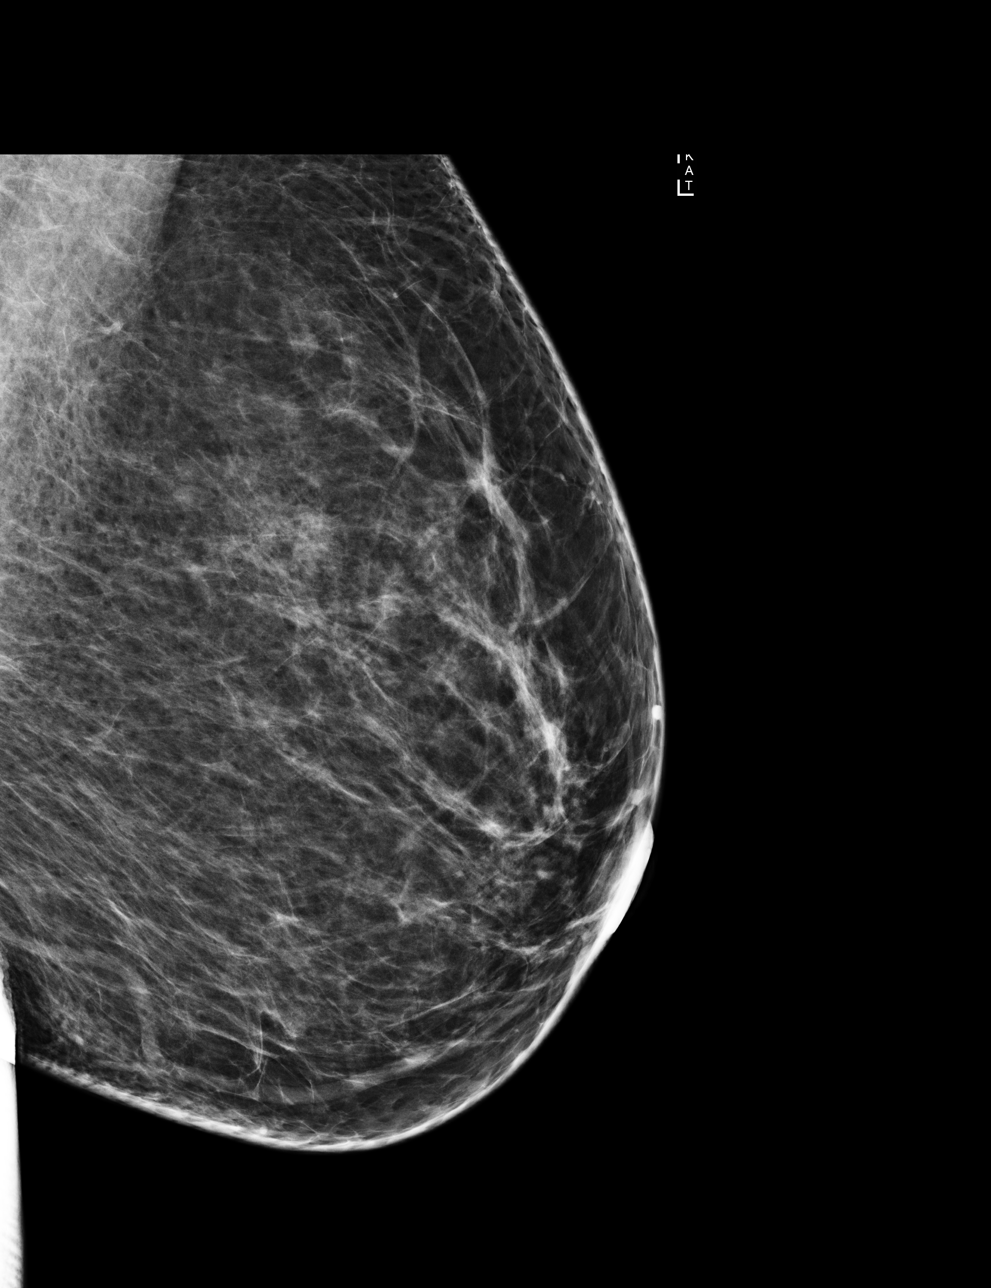

[R MLO]
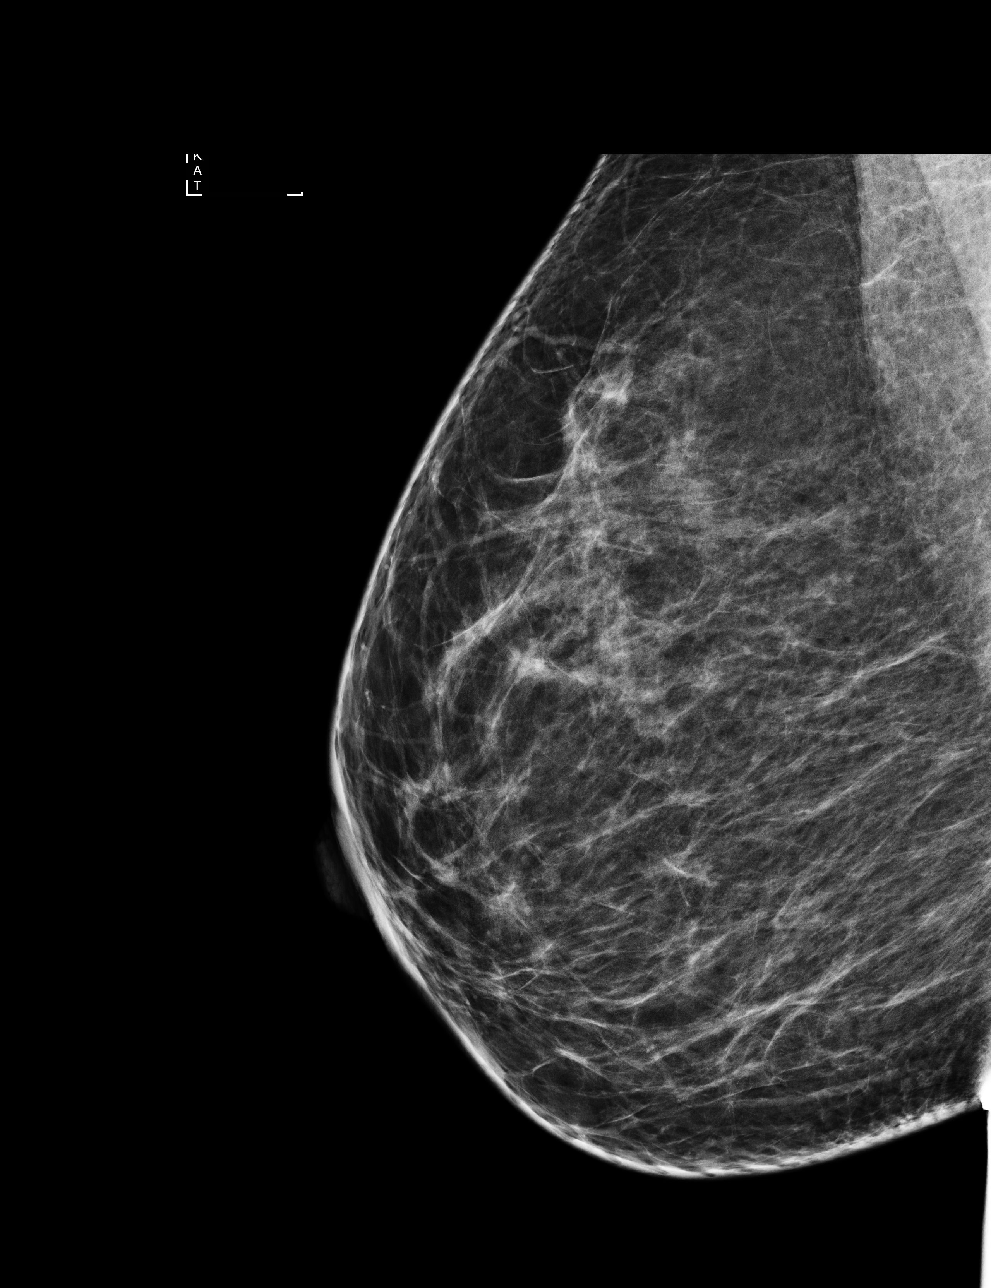

[4 of 4 positions shown; findings below may reference images not displayed]

ACR Breast Density Category b: There are scattered areas of
fibroglandular density.
FINDINGS: There are no findings suspicious for malignancy. Images were
processed with CAD.
IMPRESSION: No mammographic evidence of malignancy. A result letter of this
screening mammogram will be mailed directly to the patient.

RECOMMENDATION:
Screening mammogram in one year. (Code:[id number])

BI-RADS CATEGORY  1: Negative.

## 2018-11-10 ENCOUNTER — Telehealth: Payer: Self-pay | Admitting: Family Medicine

## 2018-11-10 NOTE — Telephone Encounter (Signed)
Spoke with Pt 

## 2018-11-10 NOTE — Telephone Encounter (Signed)
Pt called in stating she would like to speak about her bank account would like a call back to clear up the situation Please follow up

## 2020-06-03 ENCOUNTER — Emergency Department (HOSPITAL_COMMUNITY)
Admission: EM | Admit: 2020-06-03 | Discharge: 2020-06-04 | Disposition: A | Discharge status: Home / Self Care | Payer: Medicare HMO | Attending: Emergency Medicine | Admitting: Emergency Medicine

## 2020-06-03 ENCOUNTER — Encounter (HOSPITAL_COMMUNITY): Payer: Self-pay | Admitting: Emergency Medicine

## 2020-06-03 ENCOUNTER — Other Ambulatory Visit: Payer: Self-pay

## 2020-06-03 DIAGNOSIS — J4531 Mild persistent asthma with (acute) exacerbation: Secondary | ICD-10-CM | POA: Insufficient documentation

## 2020-06-03 DIAGNOSIS — Z20822 Contact with and (suspected) exposure to covid-19: Secondary | ICD-10-CM | POA: Insufficient documentation

## 2020-06-03 DIAGNOSIS — R070 Pain in throat: Secondary | ICD-10-CM | POA: Diagnosis present

## 2020-06-03 DIAGNOSIS — Z79899 Other long term (current) drug therapy: Secondary | ICD-10-CM | POA: Insufficient documentation

## 2020-06-03 MED ORDER — ALBUTEROL SULFATE HFA 108 (90 BASE) MCG/ACT IN AERS
4.0000 | INHALATION_SPRAY | Freq: Once | RESPIRATORY_TRACT | Status: AC
  Administered 2020-06-03: 4 via RESPIRATORY_TRACT
  Filled 2020-06-03: qty 6.7

## 2020-06-03 NOTE — ED Triage Notes (Signed)
Pt from home, reports asthma X1 week, home neb treatments are not working.  Pt also reports headache, sore throat, chills that all started yesterday.  Pt is vaccinated.

## 2020-06-04 ENCOUNTER — Emergency Department (HOSPITAL_COMMUNITY): Payer: Medicare HMO

## 2020-06-04 LAB — RESPIRATORY PANEL BY RT PCR (FLU A&B, COVID)
Influenza A by PCR: NEGATIVE
Influenza B by PCR: NEGATIVE
SARS Coronavirus 2 by RT PCR: NEGATIVE

## 2020-06-04 LAB — I-STAT BETA HCG BLOOD, ED (MC, WL, AP ONLY): I-stat hCG, quantitative: <5 m[IU]/mL (ref <5)

## 2020-06-04 MED ORDER — PREDNISONE 10 MG PO TABS: 20.0000 mg | ORAL_TABLET | Freq: Two times a day (BID) | ORAL | 0 refills | Status: AC

## 2020-06-04 MED ORDER — IPRATROPIUM-ALBUTEROL 0.5-2.5 (3) MG/3ML IN SOLN
3.0000 mL | Freq: Once | RESPIRATORY_TRACT | Status: AC
  Administered 2020-06-04: 3 mL via RESPIRATORY_TRACT
  Filled 2020-06-04: qty 3

## 2020-06-04 MED ORDER — PREDNISONE 20 MG PO TABS
60.0000 mg | ORAL_TABLET | Freq: Once | ORAL | Status: AC
  Administered 2020-06-04: 60 mg via ORAL
  Filled 2020-06-04: qty 3

## 2020-06-04 MED ORDER — BENZONATATE 100 MG PO CAPS: 100.0000 mg | ORAL_CAPSULE | Freq: Three times a day (TID) | ORAL | 0 refills | Status: AC | PRN

## 2020-06-04 MED ORDER — ALBUTEROL SULFATE (2.5 MG/3ML) 0.083% IN NEBU: 2.5000 mg | INHALATION_SOLUTION | Freq: Four times a day (QID) | RESPIRATORY_TRACT | 12 refills | Status: AC | PRN

## 2020-06-04 MED ORDER — ALBUTEROL SULFATE (2.5 MG/3ML) 0.083% IN NEBU: 5.0000 mg | INHALATION_SOLUTION | Freq: Once | RESPIRATORY_TRACT | Status: AC

## 2020-06-04 MED ORDER — ALBUTEROL SULFATE (2.5 MG/3ML) 0.083% IN NEBU
INHALATION_SOLUTION | RESPIRATORY_TRACT | Status: AC
  Administered 2020-06-04: 5 mg via RESPIRATORY_TRACT
  Filled 2020-06-04: qty 3

## 2020-06-04 NOTE — ED Provider Notes (Signed)
[facility] EMERGENCY DEPARTMENT Provider Note   CSN: [id number] Arrival date & time: 06/03/20  2155     History Chief Complaint  Patient presents with  . Asthma  . Headache    Norelle H Ellouise Mcwhirter is a 49 y.o. female with past medical history significant for chronic back pain, asthma.  Had Covid vaccinations.  HPI Patient presents to emergency department today with chief complaint of asthma exacerbation  x1 week.  She has been using her nebulizer and her albuterol inhaler at home without much symptom improvement.  She typically has these exacerbations when there is a change in weather, which is what prompted symptoms again this time.  She is also endorsing a sore throat, nasal congestion, nonproductive cough and chills.  Patient states she developed a headache while waiting in the lobby for 13 hours.  She took a Tylenol that she had with her and headache has resolved.  She is a history of migraines and this felt similar. She denies any sick contacts, or known Covid exposures.  Denies fevers, chest pain, abdominal pain, nausea, emesis, urinary symptoms, diarrhea.  Patient received a nebulizer treatment in triage which has improved her wheezing.  Due to language barrier, a phone interpreter was present during the history-taking and subsequent discussion (and for part of the physical exam) with this patient.     Past Medical History:  Diagnosis Date  . Anxiety   . Back pain, chronic   . Depression   . Hormone disorder   . Thyroid disease     Patient Active Problem List   Diagnosis Date Noted  . Scoliosis 05/26/2017    Past Surgical History:  Procedure Laterality Date  . BACK SURGERY  2011, 2013, 2015   x3  . HEMORRHOID SURGERY  2017  . PELVIC LAPAROSCOPY     ovarian cyst removed  . TUBAL LIGATION       OB History    Gravida  4   Para  3   Term      Preterm      AB  1   Living  3     SAB  1   TAB      Ectopic      Multiple       Live Births              Family History  Problem Relation Age of Onset  . Breast cancer Maternal Grandmother   . Diabetes Maternal Grandfather     Social History   Tobacco Use  . Smoking status: Never Smoker  . Smokeless tobacco: Never Used  Vaping Use  . Vaping Use: Never used  Substance Use Topics  . Alcohol use: No  . Drug use: No    Home Medications Prior to Admission medications   Medication Sig Start Date End Date Taking? Authorizing Provider  albuterol (PROVENTIL) (2.5 MG/3ML) 0.083% nebulizer solution Take 3 mLs (2.5 mg total) by nebulization every 6 (six) hours as needed for wheezing or shortness of breath. 06/04/20   Necie Wilcoxson E, PA-C  benzonatate (TESSALON PERLES) 100 MG capsule Take 1 capsule (100 mg total) by mouth 3 (three) times daily as needed for up to 7 days for cough. 06/04/20 06/11/20  Vedanshi Massaro E, PA-C  cholecalciferol (VITAMIN D) 1000 units tablet Take 1,000 Units by mouth daily.    [provider]  clonazePAM (KLONOPIN) 1 MG tablet Take 1 mg by mouth 2 (two) times daily.    [provider]  FLUoxetine (PROZAC) 20 MG tablet Take 20 mg by mouth daily.    [provider]  gabapentin (NEURONTIN) 400 MG capsule Take 2 tablets at night and one tablet in the AM, after one week then 2 tablets at night and one in the AM and at lunch. 01/05/18   Kerrin Champagne, MD  gabapentin (NEURONTIN) 800 MG tablet Take 800 mg by mouth 3 (three) times daily.    [provider]  levothyroxine (SYNTHROID, LEVOTHROID) 50 MCG tablet Take 50 mcg by mouth daily before breakfast.    [provider]  norethindrone (ORTHO MICRONOR) 0.35 MG tablet Take 1 tablet (0.35 mg total) by mouth daily. 01/13/17   Genia Del, MD  polyethylene glycol [facility]) packet One dose 3 times a day until bowels move. Use a maximum of 3 consecutive days 05/25/18   Elpidio Anis, PA-C  predniSONE (DELTASONE) 10 MG tablet Take 2 tablets (20 mg total)  by mouth 2 (two) times daily for 5 days. 06/04/20 06/09/20  Adin Laker E, PA-C  methylPREDNISolone (MEDROL DOSEPAK) 4 MG TBPK tablet Take as directed. 11/02/17 06/04/20  Fayrene Helper, PA-C    Allergies    Aspirin and Penicillins  Review of Systems   Review of Systems  All other systems are reviewed and are negative for acute change except as noted in the HPI.   Physical Exam Updated Vital Signs BP 134/78 (BP Location: Left Arm)   Pulse 77   Temp 98.2 F (36.8 C) (Oral)   Resp 18   SpO2 100%   Physical Exam Vitals and nursing note reviewed.  Constitutional:      General: She is not in acute distress.    Appearance: She is not ill-appearing.  HENT:     Head: Normocephalic and atraumatic.     Right Ear: Tympanic membrane and external ear normal.     Left Ear: Tympanic membrane and external ear normal.     Nose: Nose normal.     Mouth/Throat:     Mouth: Mucous membranes are moist.     Pharynx: Oropharynx is clear.     Comments: Minor erythema to oropharynx, no edema, no exudate, no tonsillar swelling, voice normal, neck supple without lymphadenopathy Eyes:     General: No scleral icterus.       Right eye: No discharge.        Left eye: No discharge.     Extraocular Movements: Extraocular movements intact.     Conjunctiva/sclera: Conjunctivae normal.     Pupils: Pupils are equal, round, and reactive to light.  Neck:     Vascular: No JVD.     Comments: Full ROM intact without spinous process TTP. No bony stepoffs or deformities, no paraspinous muscle TTP or muscle spasms. No rigidity or meningeal signs. No bruising, erythema, or swelling.  Cardiovascular:     Rate and Rhythm: Normal rate and regular rhythm.     Pulses: Normal pulses.          Radial pulses are 2+ on the right side and 2+ on the left side.     Heart sounds: Normal heart sounds.  Pulmonary:     Comments: Normal work of breathing expiratory wheeze heard. Symmetric chest rise. No rales, or rhonchi.  Oxygen  saturation 100% on room air.  She is speaking in full sentences, no nasal flaring or tripoding Abdominal:     Comments: Abdomen is soft, non-distended, and non-tender in all quadrants. No rigidity, no guarding. No peritoneal  signs.  Musculoskeletal:        General: Normal range of motion.     Cervical back: Normal range of motion.  Skin:    General: Skin is warm and dry.     Capillary Refill: Capillary refill takes less than 2 seconds.  Neurological:     Mental Status: She is oriented to person, place, and time.     GCS: GCS eye subscore is 4. GCS verbal subscore is 5. GCS motor subscore is 6.     Comments: Speech is clear and goal oriented, follows commands CN III-XII intact, no facial droop Normal strength in upper and lower extremities bilaterally including dorsiflexion and plantar flexion, strong and equal grip strength Sensation normal to light and sharp touch Moves extremities without ataxia, coordination intact Normal finger to nose and rapid alternating movements Normal gait and balance  Psychiatric:        Behavior: Behavior normal.     ED Results / Procedures / Treatments   Labs (all labs ordered are listed, but only abnormal results are displayed) Labs Reviewed  RESPIRATORY PANEL BY RT PCR (FLU A&B, COVID)  I-STAT BETA HCG BLOOD, ED ([facility], [facility], [facility] ONLY)    EKG None  Radiology DG Chest 2 View  Result Date: 06/04/2020 CLINICAL DATA:  Shortness of breath and cough.  Chills. EXAM: CHEST - 2 VIEW COMPARISON:  11/02/2017 FINDINGS: The heart size and mediastinal contours are within normal limits. Both lungs are clear. No pleural effusions. No pneumothorax. The visualized skeletal structures are unremarkable. IMPRESSION: No acute cardiopulmonary disease. Electronically Signed   By: Feliberto HartsFrederick S Jones MD   On: 06/04/2020 12:08    Procedures Procedures (including critical care time)  Medications Ordered in ED Medications  albuterol (VENTOLIN HFA) 108 (90 Base) MCG/ACT  inhaler 4 puff (4 puffs Inhalation Given 06/03/20 2304)  albuterol (PROVENTIL) (2.5 MG/3ML) 0.083% nebulizer solution 5 mg (5 mg Nebulization Given 06/04/20 0906)  predniSONE (DELTASONE) tablet 60 mg (60 mg Oral Given 06/04/20 1239)  ipratropium-albuterol (DUONEB) 0.5-2.5 (3) MG/3ML nebulizer solution 3 mL (3 mLs Nebulization Given 06/04/20 1240)    ED Course  I have reviewed the triage vital signs and the nursing notes.  Pertinent labs & imaging results that were available during my care of the patient were reviewed by me and considered in my medical decision making (see chart for details).    MDM Rules/Calculators/A&P                          History provided by patient with additional history obtained from chart review.    Patient is well-appearing.  Vital signs are stable.  On exam she is expiratory wheeze.  She has a headache that improved after taking Tylenol.  She does have a normal neuro exam, no meningeal signs.  Patient had Covid swab collected in triage and is negative for Covid and influenza.  Pregnancy test negative.  X-ray ordered as she has had symptoms x1 week. I viewed pt's chest xray and it does not suggest acute infectious processes  Patient ambulated in ED with O2 saturations maintained >90, no current signs of respiratory distress. Lung exam improved after second nebulizer treatment. Prednisone given in the ED and pt will be discharged with 5 day burst. Pt states they are breathing at baseline. Pt has been instructed to continue using prescribed medications and to speak with PCP about today's exacerbation.  Will refill nebulizer solution as well as give [employer] Perles  for cough.  The patient appears reasonably screened and/or stabilized for discharge and I doubt any other medical condition or other [facility] requiring further screening, evaluation, or treatment in the ED at this time prior to discharge. The patient is safe for discharge with strict return precautions discussed.    Reuel Boom Keyasia Jolliff was evaluated in Emergency Department on 06/04/2020 for the symptoms described in the history of present illness. She was evaluated in the context of the global COVID-19 pandemic, which necessitated consideration that the patient might be at risk for infection with the SARS-CoV-2 virus that causes COVID-19. Institutional protocols and algorithms that pertain to the evaluation of patients at risk for COVID-19 are in a state of rapid change based on information released by regulatory bodies including the CDC and federal and state organizations. These policies and algorithms were followed during the patient's care in the ED.   Portions of this note were generated with Scientist, clinical (histocompatibility and immunogenetics). Dictation errors may occur despite best attempts at proofreading.   Final Clinical Impression(s) / ED Diagnoses Final diagnoses:  Mild persistent asthma with exacerbation    Rx / DC Orders ED Discharge Orders         Ordered    predniSONE (DELTASONE) 10 MG tablet  2 times daily        06/04/20 1323    benzonatate (TESSALON PERLES) 100 MG capsule  3 times daily PRN        06/04/20 1323    albuterol (PROVENTIL) (2.5 MG/3ML) 0.083% nebulizer solution  Every 6 hours PRN        06/04/20 1323           Ruffin Lada, Caroleen Hamman, PA-C 06/04/20 1416    Margarita Grizzle, MD 06/07/20 1317

## 2020-06-04 NOTE — Discharge Instructions (Addendum)
Your Covid test and flu tests are negative  -Prescription sent to your pharmacy for prednisone.  This is a steroid to help with asthma.  -Prescription sent for Tessalon.  This is a cough medicine.  Take if needed.  -Prescription for your nebulizer solution has been sent to the pharmacy as well.  If it is not the correct kind you should contact your primary care doctor for a prescription   Return to the emergency department for any new or worsening symptoms
